# Patient Record
Sex: Female | Born: 1965 | Race: White | Hispanic: No | Marital: Single | Smoking: Never smoker
Health system: Southern US, Community
[De-identification: ages and names within clinical notes are randomized; demographics above are authoritative.]

## PROBLEM LIST (undated history)

## (undated) DIAGNOSIS — T7840XA Allergy, unspecified, initial encounter: Secondary | ICD-10-CM

## (undated) HISTORY — PX: INGUINAL HERNIA REPAIR: SUR1180

## (undated) HISTORY — DX: Allergy, unspecified, initial encounter: T78.40XA

## (undated) HISTORY — PX: TONSILLECTOMY: SHX5217

## (undated) HISTORY — PX: TONSILLECTOMY: SUR1361

---

## 2009-11-22 ENCOUNTER — Other Ambulatory Visit: Admission: RE | Admit: 2009-11-22 | Discharge: 2009-11-22 | Payer: Self-pay | Admitting: Internal Medicine

## 2009-11-22 ENCOUNTER — Encounter (INDEPENDENT_AMBULATORY_CARE_PROVIDER_SITE_OTHER): Payer: Self-pay | Admitting: *Deleted

## 2009-11-22 ENCOUNTER — Ambulatory Visit: Payer: Self-pay | Admitting: Internal Medicine

## 2009-11-22 LAB — CONVERTED CEMR LAB
CO2: 27 meq/L
Calcium: 8.9 mg/dL
Cholesterol: 252 mg/dL
Creatinine, Ser: 0.58 mg/dL
Eosinophils Relative: 3 %
GC Probe Amp, Genital: NEGATIVE
HCT: 40.6 %
HDL: 81 mg/dL
Hemoglobin: 13.6 g/dL
Lymphocytes, automated: 45 %
Monocytes Relative: 9 %
RBC: 4.34 M/uL
RDW: 13 %
Triglyceride fasting, serum: 130 mg/dL

## 2009-11-22 LAB — HM PAP SMEAR

## 2009-11-23 ENCOUNTER — Encounter: Payer: Self-pay | Admitting: Internal Medicine

## 2009-11-25 ENCOUNTER — Encounter: Payer: Self-pay | Admitting: Internal Medicine

## 2009-11-25 LAB — CONVERTED CEMR LAB: TSH: 0.986 microintl units/mL (ref 0.350–4.500)

## 2009-11-27 ENCOUNTER — Telehealth: Payer: Self-pay | Admitting: Internal Medicine

## 2010-01-07 ENCOUNTER — Telehealth: Payer: Self-pay | Admitting: Internal Medicine

## 2010-02-11 ENCOUNTER — Encounter: Admission: RE | Admit: 2010-02-11 | Discharge: 2010-02-11 | Payer: Self-pay | Admitting: Internal Medicine

## 2010-02-11 LAB — HM MAMMOGRAPHY: HM Mammogram: NEGATIVE

## 2010-03-03 ENCOUNTER — Ambulatory Visit: Payer: Self-pay | Admitting: Internal Medicine

## 2010-03-03 DIAGNOSIS — B360 Pityriasis versicolor: Secondary | ICD-10-CM | POA: Insufficient documentation

## 2010-03-10 ENCOUNTER — Telehealth: Payer: Self-pay | Admitting: Internal Medicine

## 2010-03-19 ENCOUNTER — Telehealth: Payer: Self-pay | Admitting: Internal Medicine

## 2010-04-24 ENCOUNTER — Telehealth: Payer: Self-pay | Admitting: Internal Medicine

## 2010-04-25 ENCOUNTER — Ambulatory Visit: Payer: Self-pay | Admitting: Internal Medicine

## 2010-04-25 DIAGNOSIS — L819 Disorder of pigmentation, unspecified: Secondary | ICD-10-CM | POA: Insufficient documentation

## 2010-06-03 NOTE — Progress Notes (Signed)
Summary: UTI?  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 Please advise Initial call taken by: Ami Bullins CMA,  March 10, 2010 10:08 AM  Follow-up for Phone Call        Left detailed vm on pt's hm # Follow-up by: Lamar Sprinkles, CMA,  March 10, 2010 11:24 AM    New/Updated Medications: CIPRO 250 MG TAB (CIPROFLOXACIN HCL) Take 1 tablet by mouth morning and night X 5 days Prescriptions: CIPRO 250 MG TAB (CIPROFLOXACIN HCL) Take 1 tablet by mouth morning and night X 5 days  #10 x 0   Entered and Authorized by:   Etta Grandchild MD   Signed by:   Etta Grandchild MD on 03/10/2010   Method used:   Electronically to        [facility] (retail)       [address]       [place], [place]  [address]       Ph: [phone] or [phone]       Fax: [phone]   RxID:   [id number]

## 2010-06-03 NOTE — Progress Notes (Signed)
Summary: REQ FOR RX  Phone Note Call from Patient Call back at Home Phone [phone]   Summary of Call: Pt says at last office visit Dr Yetta Barre discussed rash on her chest. She is req rx for antifungal med - ketoconozole cream.   FYI- She has "taken to heart" instruction from MD at last office visit to loose weight. Also has scheduled mamogram.  Initial call taken by: Lamar Sprinkles, CMA,  January 07, 2010 3:23 PM  Follow-up for Phone Call        left mess to call office back with pharm info Follow-up by: Lamar Sprinkles, CMA,  January 08, 2010 12:35 PM  Additional Follow-up for Phone Call Additional follow up Details #1::        Pt informed, she is also req rx for single dose of 400mg  tab along w/cream.  Additional Follow-up by: Lamar Sprinkles, CMA,  January 08, 2010 2:49 PM    New/Updated Medications: KETOCONAZOLE 2 % CREA (KETOCONAZOLE) Apply to AA two times a day Prescriptions: KETOCONAZOLE 2 % CREA (KETOCONAZOLE) Apply to AA two times a day  #30 gms x 0   Entered by:   Lamar Sprinkles, CMA   Authorized by:   Etta Grandchild MD   Signed by:   Lamar Sprinkles, CMA on 01/08/2010   Method used:   Electronically to        [facility] (retail)       [address]       [place], [place]  [address]       Ph: [phone] or [phone]       Fax: [phone]   RxID:   [id number] KETOCONAZOLE 2 % CREA (KETOCONAZOLE) Apply to AA two times a day  #30 gms x 0   Entered and Authorized by:   Etta Grandchild MD   Signed by:   Etta Grandchild MD on 01/08/2010   Method used:   Historical   RxID:   [id number]

## 2010-06-03 NOTE — Progress Notes (Signed)
  Phone Note Refill Request Message from:  Fax from Pharmacy on March 19, 2010 8:16 AM  Refills Requested: Medication #1:  KETOCONAZOLE 200 MG TABS Take one as directed.   Supply Requested: 1 month   Last Refilled: 01/19/2010 [facility]  Initial call taken by: Rock Nephew CMA,  March 19, 2010 8:16 AM    Prescriptions: KETOCONAZOLE 200 MG TABS (KETOCONAZOLE) Take one as directed  #1 x 3   Entered by:   Rock Nephew CMA   Authorized by:   Etta Grandchild MD   Signed by:   Rock Nephew CMA on 03/19/2010   Method used:   Electronically to        [facility] (retail)       [address]       [place], [place]  [address]       Ph: [phone] or [phone]       Fax: [phone]   RxID:   [id number]

## 2010-06-03 NOTE — Progress Notes (Signed)
Summary: RESULTS  Phone Note Call from Patient Call back at Home Phone [phone]   Summary of Call: Patient is requesting results she continues to have symptoms. Left vm that results letter was mailed and all was normal. Does pt need any treatment for symptoms?  Initial call taken by: Lamar Sprinkles, CMA,  November 27, 2009 4:45 PM  Follow-up for Phone Call        what symptoms? Follow-up by: Etta Grandchild MD,  November 27, 2009 5:12 PM  Additional Follow-up for Phone Call Additional follow up Details #1::        Pt has large amounts of clear vaginal discharge for at least a 1 year. This is daily and does not seem to change baised on her monthly cycle.  Says she has just "gotten used to it" and is not exactly sure how long.  She has not had intercourse in 3 years. No pain or discoloration or odor.   Pt read medical article recenlty linking this symptom to endometrial cancer and she is concerned.   Additional Follow-up by: Lamar Sprinkles, CMA,  November 27, 2009 6:25 PM    Additional Follow-up for Phone Call Additional follow up Details #2::    her PAP smear was negative for cancer Follow-up by: Etta Grandchild MD,  November 27, 2009 8:17 PM   Appended Document: RESULTS Pt informed, and advised to follow up with GYN for further concerns. She agreed

## 2010-06-03 NOTE — Assessment & Plan Note (Signed)
Summary: NEW [employer] PT-#--PKG--STC   Vital Signs:  Patient profile:   45 year old female Menstrual status:  regular LMP:     11/22/2009 Height:      64 inches Weight:      138.50 pounds BMI:     23.86 O2 Sat:      97 % on Room air Temp:     98.1 degrees F oral Pulse rate:   80 / minute Pulse rhythm:   regular Resp:     16 per minute BP sitting:   120 / 82  (left arm) Cuff size:   regular  Vitals Entered By: Rock Nephew CMA (November 22, 2009 1:32 PM)  O2 Flow:  Room air CC: New to establish Is Patient Diabetic? No Pain Assessment Patient in pain? no      LMP (date): 11/22/2009     Menstrual Status regular Enter LMP: 11/22/2009   Primary Care Provider:  Etta Grandchild MD  CC:  New to establish.  History of Present Illness: New to me for a complete physical-no complaints.  Preventive Screening-Counseling & Management  Alcohol-Tobacco     Alcohol drinks/day: <1     Alcohol type: wine     >5/day in last 3 mos: no     Alcohol Counseling: not indicated; use of alcohol is not excessive or problematic     Feels need to cut down: no     Feels annoyed by complaints: no     Feels guilty re: drinking: no     Needs 'eye opener' in am: no     Smoking Status: never  Caffeine-Diet-Exercise     Does Patient Exercise: yes  Hep-HIV-STD-Contraception     Hepatitis Risk: no risk noted     HIV Risk: no risk noted     STD Risk: no risk noted     SBE monthly: yes     SBE Education/Counseling: to perform regular SBE  Safety-Violence-Falls     Seat Belt Use: yes     Helmet Use: yes     Firearms in the Home: no firearms in the home     Smoke Detectors: yes     Violence in the Home: no risk noted     Sexual Abuse: no      Sexual History:  currently monogamous.        Drug Use:  never and no.        Blood Transfusions:  no.    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 Never Smoked Alcohol use-no Drug use-no Regular exercise-yes Smoking Status:  never Drug Use:  never, no Does Patient Exercise:  yes Hepatitis Risk:  no risk noted HIV Risk:  no risk noted STD Risk:  no risk noted Seat Belt Use:  yes Sexual History:  currently monogamous Blood Transfusions:  no  Review of Systems  The patient denies anorexia, fever, weight loss, weight gain, chest pain, dyspnea on exertion, peripheral edema, prolonged cough, hemoptysis, abdominal pain, hematuria, genital sores, suspicious skin lesions, difficulty walking, depression, abnormal bleeding, enlarged lymph nodes, angioedema, and breast masses.   GU:  Denies abnormal vaginal bleeding, decreased libido, discharge, dysuria, hematuria, urinary frequency, and urinary hesitancy.  Physical Exam  General:  alert, well-developed, well-nourished, well-hydrated, appropriate dress, normal appearance,  healthy-appearing, cooperative to examination, and good hygiene.   Head:  normocephalic and atraumatic.   Eyes:  vision grossly intact, pupils equal, pupils round, and pupils reactive to light.   Mouth:  Oral mucosa and oropharynx without lesions or exudates.  Teeth in good repair. Neck:  supple, full ROM, no masses, no thyromegaly, no JVD, normal carotid upstroke, no carotid bruits, no cervical lymphadenopathy, and no neck tenderness.   Lungs:  normal respiratory effort, no intercostal retractions, no accessory muscle use, normal breath sounds, no dullness, no fremitus, no crackles, and no wheezes.   Heart:  normal rate, regular rhythm, no murmur, no gallop, no rub, and no JVD.   Abdomen:  soft, non-tender, normal bowel sounds, no distention, no masses, no guarding, no rigidity, no rebound tenderness, no abdominal hernia, no  hepatomegaly, and no splenomegaly.   Genitalia:  normal introitus, no external lesions, mucosa pink and moist, no vaginal or cervical lesions, no vaginal atrophy, no friaility or hemorrhage, normal uterus size and position, no adnexal masses or tenderness, and vaginal discharge.   Msk:  normal ROM, no joint tenderness, no joint swelling, no joint warmth, no redness over joints, no joint deformities, no joint instability, and no crepitation.   Pulses:  R and L carotid,radial,femoral,dorsalis pedis and posterior tibial pulses are full and equal bilaterally Extremities:  No clubbing, cyanosis, edema, or deformity noted with normal full range of motion of all joints.   Neurologic:  No cranial nerve deficits noted. Station and gait are normal. Plantar reflexes are down-going bilaterally. DTRs are symmetrical throughout. Sensory, motor and coordinative functions appear intact. Skin:  turgor normal, color normal, no rashes, no suspicious lesions, no ecchymoses, no petechiae, no purpura, no ulcerations, and no edema.   Cervical Nodes:  no anterior cervical adenopathy and no posterior cervical adenopathy.   Axillary Nodes:  no R axillary adenopathy and no L axillary adenopathy.   Inguinal Nodes:  no R inguinal adenopathy and no L inguinal adenopathy.   Psych:  Cognition and judgment appear intact. Alert and cooperative with normal attention span and concentration. No apparent delusions, illusions, hallucinations   Impression & Recommendations:  Problem # 1:  VAGINAL DISCHARGE (ICD-623.5) Assessment New  Orders: T-Chlamydia Probe, genital [id number]) T-GC Probe, genital [phone])  Problem # 2:  ROUTINE GENERAL MEDICAL EXAM@HEALTH  CARE FACL (ICD-V70.0) Assessment: New  Orders: Venipuncture ([address]) TLB-Lipid Panel (80061-LIPID) TLB-BMP (Basic Metabolic Panel-BMET) (80048-METABOL) TLB-CBC Platelet - w/Differential (85025-CBCD) TLB-TSH (Thyroid Stimulating Hormone) [id number]) Radiology  Referral (Radiology)  Discussed using sunscreen, use of alcohol, drug use, self breast exam, routine dental care, routine eye care, schedule for GYN exam, routine physical exam, seat belts, multiple vitamins, osteoporosis prevention, adequate calcium intake in diet, recommendations for immunizations, mammograms and Pap smears.  Discussed exercise and checking cholesterol.  Discussed gun safety, safe sex, and contraception.  Patient Instructions: 1)  It is important that you exercise regularly at least 20 minutes 5 times a week. If you develop chest pain, have severe difficulty breathing, or feel very tired , stop exercising immediately and seek medical attention. 2)  You need to lose weight. Consider a lower calorie diet and regular exercise.  3)  Schedule your mammogram.   Tetanus/Td Immunization History:    Tetanus/Td # 1:  Tdap (01/12/2007)

## 2010-06-03 NOTE — Letter (Signed)
Summary: Results Follow up Letter  [facility]  [address] [place], [place] [address]   Phone: [phone]  Fax: [phone]    03/03/2010 MRN: [id number]  [facility] [address] [place], [place]  [address]   To Whom It May Concern:   The above named patient received a flu vaccine in our office today.    Lot# [id number]  Exp Date: 11/01/2010  Right Deltoid    Sincerely,   Fayne Mediate, CMA for Dr. Sanda Linger

## 2010-06-03 NOTE — Assessment & Plan Note (Signed)
Summary: BROWN SPOTS/CHEST--AND FLU SHOT-CREAM NOT WORK'G--STC   Vital Signs:  Patient profile:   45 year old female Menstrual status:  regular LMP:     02/11/2010 Height:      64 inches Weight:      134 pounds BMI:     23.08 O2 Sat:      97 % on Room air Temp:     98.0 degrees F oral Pulse rate:   62 / minute Pulse rhythm:   regular Resp:     16 per minute BP sitting:   118 / 68  (left arm) Cuff size:   regular  Vitals Entered By: Rock Nephew CMA (March 03, 2010 8:19 AM)  O2 Flow:  Room air CC: follow-up visit// discuss brown spots on chest/ flu shot, Rash Is Patient Diabetic? No Pain Assessment Patient in pain? no      LMP (date): 02/11/2010     Enter LMP: 02/11/2010 Last PAP Result NEGATIVE FOR INTRAEPITHELIAL LESIONS OR MALIGNANCY.   Primary Care Graeme Menees:  Etta Grandchild MD  CC:  follow-up visit// discuss brown spots on chest/ flu shot and Rash.  History of Present Illness:  Rash      This is a 45 year old woman who presents with Rash.  The symptoms began 4 weeks ago.  The severity is described as mild.  The patient reports macules, but denies papules, nodules, hives, welts, pustules, blisters, ulcers, itching, scaling, weeping, oozing, redness, increased warmth, and tenderness.  The rash is located on the chest.  The patient denies the following symptoms: fever, headache, facial swelling, tongue swelling, burning, difficulty breathing, abdominal pain, nausea, vomiting, diarrhea, dizziness, sore throat, dysuria, eye symptoms, arthralgias, and vaginal discharge.  The patient denies history of recent tick bite, recent tick exposure, other insect bite, recent infection, recent antibiotic use, new medication, new clothing, new topical exposure, recent travel, pet/animal contact, thyroid disease, chronic liver disease, autoimmune disease, chronic edema, and prior STD.    Preventive Screening-Counseling & Management  Alcohol-Tobacco     Alcohol drinks/day: <1  Alcohol type: wine     >5/day in last 3 mos: no     Alcohol Counseling: not indicated; use of alcohol is not excessive or problematic     Feels need to cut down: no     Feels annoyed by complaints: no     Feels guilty re: drinking: no     Needs 'eye opener' in am: no     Smoking Status: never     Tobacco Counseling: not indicated; no tobacco use  Hep-HIV-STD-Contraception     Hepatitis Risk: no risk noted     HIV Risk: no risk noted     STD Risk: no risk noted     SBE monthly: yes     SBE Education/Counseling: to perform regular SBE      Sexual History:  not active.        Drug Use:  never.        Blood Transfusions:  no.    Clinical Review Panels:  Prevention   Last Mammogram:  ASSESSMENT: Negative - BI-RADS 1^MM DIGITAL SCREENING (02/11/2010)   Last Pap Smear:  NEGATIVE FOR INTRAEPITHELIAL LESIONS OR MALIGNANCY. (11/22/2009)  Immunizations   Last Tetanus Booster:  Tdap (01/12/2007)   Last Flu Vaccine:  Fluvax 3+ (03/03/2010)  Lipid Management   Cholesterol:  252 (11/22/2009)   LDL (bad choesterol):  26 (11/22/2009)   HDL (good cholesterol):  81 (11/22/2009)   Triglycerides:  130 (  11/22/2009)  Diabetes Management   Creatinine:  0.58 (11/22/2009)   Last Flu Vaccine:  Fluvax 3+ (03/03/2010)  CBC   WBC:  5.6 (11/22/2009)   RBC:  4.34 (11/22/2009)   Hgb:  13.6 (11/22/2009)   Hct:  40.6 (11/22/2009)   Platelets:  297 (11/22/2009)   MCV  93.5 (11/22/2009)   RDW  13.0 (11/22/2009)   Monos:  9% (11/22/2009)   Eosinophils:  3% (11/22/2009)   Basophil:  1% (11/22/2009)  Complete Metabolic Panel   Sodium:  137 (11/22/2009)   Potassium:  4.1 (11/22/2009)   Chloride:  104 (11/22/2009)   CO2:  27 (11/22/2009)   BUN:  11 (11/22/2009)   Creatinine:  0.58 (11/22/2009)   Calcium:  8.9 (11/22/2009)   Medications Prior to Update: 1)  Ketoconazole 2 % Crea (Ketoconazole) .... Apply To Aa Two Times A Day  Current Medications (verified): 1)  Ketoconazole 200 Mg Tabs  (Ketoconazole) .... 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 Family History High cholesterol Family History Hypertension  Social History: Reviewed history from 11/22/2009 and no changes required. 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    Physical Exam  General:  alert, well-developed, well-nourished, well-hydrated, appropriate dress, normal appearance, healthy-appearing, cooperative to examination, and good hygiene.   Mouth:  Oral mucosa and oropharynx without lesions or exudates.  Teeth in good repair. Neck:  supple, full ROM, no masses, no thyromegaly, no JVD, normal carotid upstroke, no carotid bruits, no cervical lymphadenopathy, and no neck tenderness.   Lungs:  normal respiratory effort, no intercostal retractions, no accessory muscle use, normal breath sounds, no dullness, no fremitus, no crackles, and no wheezes.   Heart:  normal rate, regular rhythm, no murmur, no gallop, no rub, and no JVD.   Abdomen:  soft, non-tender, normal bowel sounds, no distention, no masses, no guarding, no  rigidity, no rebound tenderness, no abdominal hernia, no hepatomegaly, and no splenomegaly.   Msk:  normal ROM, no joint tenderness, no joint swelling, no joint warmth, no redness over joints, no joint deformities, no joint instability, and no crepitation.   Pulses:  R and L carotid,radial,femoral,dorsalis pedis and posterior tibial pulses are full and equal bilaterally Extremities:  No clubbing, cyanosis, edema, or deformity noted with normal full range of motion of all joints.   Neurologic:  No cranial nerve deficits noted. Station and gait are normal. Plantar reflexes are down-going bilaterally. DTRs are symmetrical throughout. Sensory, motor and coordinative functions appear intact. Skin:  she has hypopigmented macules across her chest Cervical Nodes:  no anterior cervical adenopathy and no posterior cervical adenopathy.   Axillary Nodes:  no R axillary adenopathy and no L axillary adenopathy.   Inguinal Nodes:  no R inguinal adenopathy and no L inguinal adenopathy.  Psych:  Cognition and judgment appear intact. Alert and cooperative with normal attention span and concentration. No apparent delusions, illusions, hallucinations   Impression & Recommendations:  Problem # 1:  TINEA VERSICOLOR (ICD-111.0)  The following medications were removed from the medication list:    Ketoconazole 2 % Crea (Ketoconazole) .Marland Kitchen... Apply to aa two times a day Her updated medication list for this problem includes:    Ketoconazole 200 Mg Tabs (Ketoconazole) .Marland Kitchen... Take one as directed  Complete Medication List: 1)  Ketoconazole 200 Mg Tabs (Ketoconazole) .... Take one as directed  Other Orders: Admin 1st Vaccine (960[address]) Flu Vaccine 80yrs + (40981)  Patient Instructions: 1)  Please schedule a follow-up appointment in 3 months. 2)  If you are having sex and you or your partner don't want a child, use contraception. Prescriptions: KETOCONAZOLE 200 MG TABS (KETOCONAZOLE) Take one as directed  #1 x 2    Entered and Authorized by:   Etta Grandchild MD   Signed by:   Etta Grandchild MD on 03/03/2010   Method used:   Electronically to        [facility] [address] [facility] (retail)       [address]       [place], [place]  [address]       Ph: [phone]       Fax: [phone]   RxID:   [id number]    Orders Added: 1)  Admin 1st Vaccine [90471] 2)  Flu Vaccine 13yrs + [36644] 3)  Est. Patient Level IV [03474]    .lbflu Flu Vaccine Consent Questions     Do you have a history of severe allergic reactions to this vaccine? no    Any prior history of allergic reactions to egg and/or gelatin? no    Do you have a sensitivity to the preservative Thimersol? no    Do you have a past history of Guillan-Barre Syndrome? no    Do you currently have an acute febrile illness? no    Have you ever had a severe reaction to latex? no    Vaccine information given and explained to patient? yes    Are you currently pregnant? no    Lot Number:[id number]   Exp Date:11/01/2010   Site Given Right Deltoid IM

## 2010-06-03 NOTE — Letter (Signed)
Summary: Results Follow-up Letter  [facility]  [address] [place], [place] [address]   Phone: [phone]  Fax: [phone]    11/25/2009  [address] [place], [place]  [address]  Dear Connie Freeman,   The following are the results of your recent test(s):  Test     Result     Pap Smear    Normal___xx____  Not Normal_____       Comments:tests for infection are negative and thyroid test is normal    _________________________________________________________  Please call for an appointment as needed _________________________________________________________ _________________________________________________________ _________________________________________________________  Sincerely,  Sanda Linger MD [facility]

## 2010-06-03 NOTE — Letter (Signed)
Summary: Lipid Letter  [facility]  [address] [place], [place] [address]   Phone: [phone]  Fax: [phone]    11/25/2009  Connie Freeman [address] [place], [place]  [address]  Dear Marcelino Duster:  We have carefully reviewed your last lipid profile from 11/22/2009 and the results are noted below with a summary of recommendations for lipid management.    Cholesterol:       252     Goal: <200   HDL "good" Cholesterol:   81     Goal: >40-great news!!!!!!!   LDL "bad" Cholesterol:   145     Goal: <130   Triglycerides:       130     Goal: <150    your other labs look great    TLC Diet (Therapeutic Lifestyle Change): Saturated Fats & Transfatty acids should be kept < 7% of total calories ***Reduce Saturated Fats Polyunstaurated Fat can be up to 10% of total calories Monounsaturated Fat Fat can be up to 20% of total calories Total Fat should be no greater than 25-35% of total calories Carbohydrates should be 50-60% of total calories Protein should be approximately 15% of total calories Fiber should be at least 20-30 grams a day ***Increased fiber may help lower LDL Total Cholesterol should be < 200mg /day Consider adding plant stanol/sterols to diet (example: Benacol spread) ***A higher intake of unsaturated fat may reduce Triglycerides and Increase HDL    Adjunctive Measures (may lower LIPIDS and reduce risk of Heart Attack) include: Aerobic Exercise (20-30 minutes 3-4 times a week) Limit Alcohol Consumption Weight Reduction Aspirin 75-81 mg a day by mouth (if not allergic or contraindicated) Dietary Fiber 20-30 grams a day by mouth     Current Medications:  None If you have any questions, please call. We appreciate being able to work with you.   Sincerely,    [facility] Etta Grandchild MD

## 2010-06-05 NOTE — Assessment & Plan Note (Signed)
Summary: FOLLOW UP FOR PT FOR RX-LB   Vital Signs:  Patient profile:   45 year old female Menstrual status:  regular LMP:     04/12/2010 Height:      64 inches Weight:      137 pounds BMI:     23.60 O2 Sat:      98 % on Room air Temp:     98.3 degrees F oral Pulse rate:   73 / minute Pulse rhythm:   regular Resp:     16 per minute BP sitting:   116 / 80  (left arm) Cuff size:   regular  Vitals Entered By: Rock Nephew CMA (April 25, 2010 8:12 AM)  O2 Flow:  Room air CC: Patient here to discuss hormones and medication Is Patient Diabetic? No Pain Assessment Patient in pain? no       Does patient need assistance? Functional Status Self care Ambulation Normal LMP (date): 04/12/2010     Enter LMP: 04/12/2010 Last PAP Result NEGATIVE FOR INTRAEPITHELIAL LESIONS OR MALIGNANCY.   Primary Care Provider:  Etta Grandchild MD  CC:  Patient here to discuss hormones and medication.  History of Present Illness: She returns today and tells me that she wants to start OCP's again. She took Demulen for 10 years and she tells me that it helped with her dysmenorrhea symptoms (bloating and mood swings). Her last menstrual cycle was normal otherwise.  Also, she complains of persistent rash over her anterior chest that she feels is unsightly but it does not bother her. She does not like the abnormal appearance of the pigment.  Preventive Screening-Counseling & Management  Alcohol-Tobacco     Alcohol drinks/day: <1     Alcohol type: wine     >5/day in last 3 mos: no     Alcohol Counseling: not indicated; use of alcohol is not excessive or problematic     Feels need to cut down: no     Feels annoyed by complaints: no     Feels guilty re: drinking: no     Needs 'eye opener' in am: no     Smoking Status: never     Tobacco Counseling: not indicated; no tobacco use  Current Medications (verified): 1)  None  Allergies (verified): No Known Drug Allergies  Past  History:  Past Medical History: Last updated: 11/22/2009 Depression  Past Surgical History: Last updated: 11/22/2009 Tonsillectomy  Family History: Last updated: 11/22/2009 Family History High cholesterol Family History Hypertension  Social History: Last updated: 11/22/2009 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 Family History High cholesterol Family History Hypertension  Social History: Reviewed history from 11/22/2009 and no changes required. 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   GU:  Denies abnormal vaginal bleeding and discharge. Derm:  Complains of changes in color of skin and rash; denies changes in nail beds, dryness, excessive perspiration, flushing, insect bite(s), itching, and poor wound healing.  Physical  Exam  General:  alert, well-developed, well-nourished, well-hydrated, appropriate dress, normal appearance, healthy-appearing, cooperative to examination, and good hygiene.   Head:  normocephalic and atraumatic.   Eyes:  vision grossly intact, pupils equal, pupils round, and pupils reactive to light.   Mouth:  Oral mucosa and oropharynx without lesions or exudates.  Teeth in good repair. Neck:  supple, full ROM, no masses, no thyromegaly, no JVD,  normal carotid upstroke, no carotid bruits, no cervical lymphadenopathy, and no neck tenderness.   Lungs:  normal respiratory effort, no intercostal retractions, no accessory muscle use, normal breath sounds, no dullness, no fremitus, no crackles, and no wheezes.   Heart:  normal rate, regular rhythm, no murmur, no gallop, no rub, and no JVD.   Abdomen:  soft, non-tender, normal bowel sounds, no distention, no masses, no guarding, no rigidity, no rebound tenderness, no abdominal hernia, no hepatomegaly, and no splenomegaly.   Msk:  normal ROM, no joint tenderness, no joint swelling, no joint warmth, no redness over joints, no joint deformities, no joint instability, and no crepitation.   Pulses:  R and L carotid,radial,femoral,dorsalis pedis and posterior tibial pulses are full and equal bilaterally Extremities:  No clubbing, cyanosis, edema, or deformity noted with normal full range of motion of all joints.   Neurologic:  No cranial nerve deficits noted. Station and gait are normal. Plantar reflexes are down-going bilaterally. DTRs are symmetrical throughout. Sensory, motor and coordinative functions appear intact. Skin:  she has abnormal pigment changes over her anterior chest that is c/w chloasma. turgor normal, no rashes, no suspicious lesions, no ecchymoses, no petechiae, no purpura, no ulcerations, and no edema.   Cervical Nodes:  no anterior cervical adenopathy and no posterior cervical adenopathy.   Axillary Nodes:  no R axillary adenopathy and no L axillary adenopathy.   Inguinal Nodes:  no R inguinal adenopathy and no L inguinal adenopathy.   Psych:  Cognition and judgment appear intact. Alert and cooperative with normal attention span and concentration. No apparent delusions, illusions, hallucinations   Impression & Recommendations:  Problem # 1:  MELASMA (ICD-709.09) Assessment New  Orders: Dermatology Referral (Derma)  Problem # 2:  CONTRACEPTIVE MANAGEMENT  (ICD-V25.09) Assessment: New I looked for Demulen but I could not find it on the meds list so I wrote an Rx for Zovia which has the same ingredients, she will start the Zovia on the first "Sunday after her next cycle Orders: TLB-Preg Serum Quant (B-hCG) (84702-HCG-QN)  Problem # 3:  TINEA VERSICOLOR (ICD-111.0) Assessment: Improved  The following medications were removed from the medication list:    Ketoconazole 200 Mg Tabs (Ketoconazole) ..... Take one as directed  Complete Medication List: 1)  Zovia 1/35e (28) 1-35 Mg-mcg Tabs (Ethynodiol diac-eth estradiol) .... Take as directed  Patient Instructions: 1)  Please schedule a follow-up appointment in 6 months. 2)  If you could be exposed to sexually transmitted diseases, you should use a condom. 3)  If you are having sex and you or your partner don't want a child, use contraception. Prescriptions: ZOVIA 1/35E (28) 1-35 MG-MCG TABS (ETHYNODIOL DIAC-ETH ESTRADIOL) take as directed  #1 month x 11   Entered and Authorized by:   Chrishauna Mee L. Jermika Olden MD   Signed by:   Bayyinah Dukeman L. Deandrea Rion MD on 04/25/2010   Method used:   Electronically to        [facility] [address] [facility] (retail)       [address]  [place], [place]  [address]       Ph: [phone] or [phone]       Fax: [phone]   RxID:   [id number]    Orders Added: 1)  Dermatology Referral [Derma] 2)  TLB-Preg Serum Quant (B-hCG) [84702-HCG-QN] 3)  Est. Patient Level IV [55732]

## 2010-06-05 NOTE — Progress Notes (Signed)
Summary: rx for BCP  Phone Note Call from Patient Call back at Home Phone [phone]   Caller: Patient Summary of Call: Pt is requesting rx for [facility] pill Demulen be sent to [facility] Initial call taken by: Brenton Grills CMA Duncan Dull),  April 24, 2010 9:02 AM  Follow-up for Phone Call        I need to do a pregnancy test first, have her come in tomorrow Follow-up by: Etta Grandchild MD,  April 24, 2010 9:13 AM  Additional Follow-up for Phone Call Additional follow up Details #1::        for OV or Nurse visit? Additional Follow-up by: Brenton Grills CMA Duncan Dull),  April 24, 2010 9:30 AM    Additional Follow-up for Phone Call Additional follow up Details #2::    OV Follow-up by: Etta Grandchild MD,  April 24, 2010 9:41 AM  Additional Follow-up for Phone Call Additional follow up Details #3:: Details for Additional Follow-up Action Taken: left message for pt to callback office to schedule OV   appointment scheduled 04/25/2010 8:00am Brenton Grills CMA (AAMA)  April 24, 2010 11:52 AM  Additional Follow-up by: Brenton Grills CMA Duncan Dull),  April 24, 2010 9:46 AM

## 2010-06-09 ENCOUNTER — Ambulatory Visit: Payer: Self-pay | Admitting: Internal Medicine

## 2010-06-27 ENCOUNTER — Ambulatory Visit (INDEPENDENT_AMBULATORY_CARE_PROVIDER_SITE_OTHER): Payer: 59 | Admitting: Internal Medicine

## 2010-06-27 ENCOUNTER — Encounter: Payer: Self-pay | Admitting: Internal Medicine

## 2010-06-27 ENCOUNTER — Telehealth: Payer: Self-pay | Admitting: Internal Medicine

## 2010-06-27 DIAGNOSIS — J3089 Other allergic rhinitis: Secondary | ICD-10-CM | POA: Insufficient documentation

## 2010-06-27 DIAGNOSIS — J069 Acute upper respiratory infection, unspecified: Secondary | ICD-10-CM

## 2010-06-27 DIAGNOSIS — J45901 Unspecified asthma with (acute) exacerbation: Secondary | ICD-10-CM | POA: Insufficient documentation

## 2010-07-01 NOTE — Assessment & Plan Note (Signed)
Summary: ALLERGIES/NWS   Vital Signs:  Patient profile:   45 year old female Menstrual status:  regular Height:      64 inches Weight:      138 pounds BMI:     23.77 O2 Sat:      97 % on Room air Temp:     98.3 degrees F oral Pulse rate:   90 / minute Pulse rhythm:   regular Resp:     16 per minute BP sitting:   110 / 70  (left arm) Cuff size:   regular  Vitals Entered By: Bill Salinas CMA (June 27, 2010 11:23 AM)  O2 Flow:  Room air  Primary Care Provider:  Etta Grandchild MD  CC:  URI symptoms.  History of Present Illness:  URI Symptoms      This is a 45 year old woman who presents with URI symptoms.  The symptoms began 3 days ago.  The severity is described as mild.  The patient reports nasal congestion, clear nasal discharge, sore throat, and dry cough, but denies productive cough, earache, and sick contacts.  Associated symptoms include dyspnea and wheezing.  The patient denies stiff neck, rash, vomiting, diarrhea, use of an antipyretic, and response to antipyretic.  The patient also reports sneezing and seasonal symptoms.  The patient denies response to antihistamine, headache, muscle aches, and severe fatigue.  The patient denies the following risk factors for Strep sinusitis: unilateral facial pain, unilateral nasal discharge, poor response to decongestant, double sickening, tooth pain, Strep exposure, tender adenopathy, and absence of cough.    Preventive Screening-Counseling & Management  Alcohol-Tobacco     Alcohol drinks/day: <1     Alcohol type: wine     >5/day in last 3 mos: no     Alcohol Counseling: not indicated; use of alcohol is not excessive or problematic     Feels need to cut down: no     Feels annoyed by complaints: no     Feels guilty re: drinking: no     Needs 'eye opener' in am: no     Smoking Status: never     Tobacco Counseling: not indicated; no tobacco use  Hep-HIV-STD-Contraception     Hepatitis Risk: no risk noted     HIV Risk: no risk  noted     STD Risk: no risk noted     SBE monthly: yes     SBE Education/Counseling: to perform regular SBE      Sexual History:  not active.        Drug Use:  never.        Blood Transfusions:  no.    Clinical Review Panels:  Prevention   Last Mammogram:  ASSESSMENT: Negative - BI-RADS 1^MM DIGITAL SCREENING (02/11/2010)   Last Pap Smear:  NEGATIVE FOR INTRAEPITHELIAL LESIONS OR MALIGNANCY. (11/22/2009)  Immunizations   Last Tetanus Booster:  Tdap (01/12/2007)   Last Flu Vaccine:  Fluvax 3+ (03/03/2010)  Lipid Management   Cholesterol:  252 (11/22/2009)   LDL (bad choesterol):  26 (11/22/2009)   HDL (good cholesterol):  81 (11/22/2009)   Triglycerides:  130 (11/22/2009)  Diabetes Management   Creatinine:  0.58 (11/22/2009)   Last Flu Vaccine:  Fluvax 3+ (03/03/2010)  CBC   WBC:  5.6 (11/22/2009)   RBC:  4.34 (11/22/2009)   Hgb:  13.6 (11/22/2009)   Hct:  40.6 (11/22/2009)   Platelets:  297 (11/22/2009)   MCV  93.5 (11/22/2009)   RDW  13.0 (11/22/2009)  Monos:  9% (11/22/2009)   Eosinophils:  3% (11/22/2009)   Basophil:  1% (11/22/2009)  Complete Metabolic Panel   Sodium:  137 (11/22/2009)   Potassium:  4.1 (11/22/2009)   Chloride:  104 (11/22/2009)   CO2:  27 (11/22/2009)   BUN:  11 (11/22/2009)   Creatinine:  0.58 (11/22/2009)   Calcium:  8.9 (11/22/2009)   Medications Prior to Update: 1)  Zovia 1/35e (28) 1-35 Mg-Mcg Tabs (Ethynodiol Diac-Eth Estradiol) .... Take As Directed  Current Medications (verified): 1)  Zovia 1/35e (28) 1-35 Mg-Mcg Tabs (Ethynodiol Diac-Eth Estradiol) .... Take As Directed 2)  Zutripro 60-4-5 Mg/86ml Soln (Pseudoeph-Chlorphen-Hydrocod) .... 5 Ml By Mouth Qid As Needed For Cough and Congestion 3)  Symbicort 160-4.5 Mcg/act Aero (Budesonide-Formoterol Fumarate) .... 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 Family History High cholesterol Family History Hypertension  Social History: Reviewed history from 11/22/2009 and no changes required. 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  The patient denies anorexia, fever, weight loss, decreased hearing, chest pain, syncope, dyspnea on exertion, peripheral edema, prolonged cough, headaches, hemoptysis, abdominal pain, hematuria, muscle weakness, suspicious skin lesions, transient blindness, enlarged lymph nodes, and angioedema.    Physical Exam  General:  alert, well-developed, well-nourished, well-hydrated, appropriate dress, normal appearance, healthy-appearing, cooperative to examination, and good hygiene.   Head:  normocephalic and atraumatic.   Eyes:  vision grossly intact, pupils equal, pupils round, and pupils reactive to light.   Ears:  R ear normal and L ear normal.   Nose:  no external deformity, clear nasal discharge, mucosal pallor, mucosal erythema, and mucosal edema.  no external deformity, no airflow obstruction, no intranasal foreign body, no nasal polyps, no nasal mucosal lesions, no mucosal friability, no active bleeding or clots, no sinus percussion tenderness, no septum abnormalities. Mouth:  good dentition, pharynx pink and  moist, no erythema, no exudates, no posterior lymphoid hypertrophy, no lesions, no aphthous ulcers, no erosions, no tongue abnormalities, and no leukoplakia.   Neck:  supple, full ROM, no masses, no JVD, no HJR, and normal carotid upstroke.   Lungs:  normal respiratory effort, no accessory muscle use, no dullness, no fremitus, no crackles, R diffuse crackles, and L diffuse crackles.  She has good air movement. Heart:  normal rate, regular rhythm, no murmur, no gallop, and no rub.   Abdomen:  soft, non-tender, normal bowel sounds, no distention, no masses, no guarding, no rigidity, no rebound tenderness, no abdominal hernia, no hepatomegaly, and no splenomegaly.   Msk:  normal ROM, no joint tenderness, no joint swelling, no joint warmth, no redness over joints, no joint deformities, no joint instability, and no crepitation.   Pulses:  R and L carotid,radial,femoral,dorsalis pedis and posterior tibial pulses are full and equal bilaterally Extremities:  No clubbing, cyanosis, edema, or deformity noted with normal  full range of motion of all joints.   Neurologic:  No cranial nerve deficits noted. Station and gait are normal. Plantar reflexes are down-going bilaterally. DTRs are symmetrical throughout. Sensory, motor and coordinative functions appear intact. Skin:  she has abnormal pigment changes over her anterior chest that is c/w chloasma. turgor normal, no rashes, no suspicious lesions, no ecchymoses, no petechiae, no purpura, no ulcerations, and no edema.   Cervical Nodes:  no anterior cervical adenopathy and no posterior cervical adenopathy.   Psych:  Cognition and judgment appear intact. Alert and cooperative with normal attention span and concentration. No apparent delusions, illusions, hallucinations   Impression & Recommendations:  Problem # 1:  URI (ICD-465.9) Assessment New  this sounds viral so no anitbiotics were given Her updated medication list for this problem includes:    Zutripro  60-4-5 Mg/69ml Soln (Pseudoeph-chlorphen-hydrocod) .Marland KitchenMarland KitchenMarland KitchenMarland Kitchen 5 ml by mouth qid as needed for cough and congestion  Instructed on symptomatic treatment. Call if symptoms persist or worsen.   Problem # 2:  ALLERGIC RHINITIS DUE TO OTHER ALLERGEN (ICD-477.8) Assessment: New  give depo-medrol IM  Orders: Admin of Therapeutic Inj  intramuscular or subcutaneous ([address]) Depo- Medrol 80mg  (J1040) Depo- Medrol 40mg  (J1030)  Problem # 3:  ASTHMA NOS W/ACUTE EXACERBATION (UEA-540.98)  Her updated medication list for this problem includes:    Symbicort 160-4.5 Mcg/act Aero (Budesonide-formoterol fumarate) .Marland Kitchen... 2 puffs two times a day for wheezing  Complete Medication List: 1)  Zovia 1/35e (28) 1-35 Mg-mcg Tabs (Ethynodiol diac-eth estradiol) .... Take as directed 2)  Zutripro 60-4-5 Mg/4ml Soln (Pseudoeph-chlorphen-hydrocod) .... 5 ml by mouth qid as needed for cough and congestion 3)  Symbicort 160-4.5 Mcg/act Aero (Budesonide-formoterol fumarate) .... 2 puffs two times a day for wheezing  Patient Instructions: 1)  Please schedule a follow-up appointment in 2 weeks. 2)  Get plenty of rest, drink lots of clear liquids, and use Tylenol or Ibuprofen for fever and comfort. Return in 7-10 days if you're not better:sooner if you're feeling worse. Prescriptions: SYMBICORT 160-4.5 MCG/ACT AERO (BUDESONIDE-FORMOTEROL FUMARATE) 2 puffs two times a day for wheezing  #4 inhs x 0   Entered and Authorized by:   Etta Grandchild MD   Signed by:   Etta Grandchild MD on 06/27/2010   Method used:   Samples Given   RxID:   [id number] ZUTRIPRO 60-4-5 MG/5ML SOLN (PSEUDOEPH-CHLORPHEN-HYDROCOD) 5 ml by mouth QID as needed for cough and congestion  #8 ounces x 0   Entered and Authorized by:   Etta Grandchild MD   Signed by:   Etta Grandchild MD on 06/27/2010   Method used:   Print then Give to Patient   RxID:   [id number]    Medication Administration  Injection # 1:    Medication: Depo- Medrol  40mg     Diagnosis: ALLERGIC RHINITIS DUE TO OTHER ALLERGEN (ICD-477.8)    Route: IM    Site: LUOQ gluteus    Exp Date: 11/2012    Lot #: [id number]    Mfr: Pharmacia    Patient tolerated injection without complications    Given by: Ami Bullins CMA (June 27, 2010 11:25 AM)  Injection # 2:    Medication: Depo- Medrol 80mg     Diagnosis: ALLERGIC RHINITIS DUE TO OTHER ALLERGEN (ICD-477.8)    Route: IM    Site: LUOQ gluteus    Exp Date: 11/2012    Lot #: [id number]    Mfr: Pharmacia  Orders Added: 1)  Admin of Therapeutic  Inj  intramuscular or subcutaneous [[address]] 2)  Depo- Medrol 80mg  [J1040] 3)  Depo- Medrol 40mg  [J1030] 4)  Est. Patient Level V [[address]]

## 2010-07-01 NOTE — Progress Notes (Signed)
  Phone Note Other Incoming   Caller: [phone] Summary of Call: Pt called and states she is having severe allergic rhinitis. She states she has these episodes and when she lived in [place] she would get a 3 in one shot. Pt is reqeusting to come in and get a shot before she goes to work [employer]. Please Advise Initial call taken by: Ami Bullins CMA,  June 27, 2010 9:20 AM  Follow-up for Phone Call        I don't know what a 3 in 1 shot is but she can come in for a depo-medrol IM injection if she wants to Follow-up by: Etta Grandchild MD,  June 27, 2010 9:41 AM  Additional Follow-up for Phone Call Additional follow up Details #1::        pt worked in with Dr Yetta Barre this morning Additional Follow-up by: Ami Bullins CMA,  June 27, 2010 9:47 AM

## 2010-07-09 ENCOUNTER — Telehealth: Payer: Self-pay | Admitting: Internal Medicine

## 2010-07-15 NOTE — Progress Notes (Signed)
  Phone Note Refill Request Message from:  Fax from Pharmacy on July 09, 2010 2:53 PM  Refills Requested: Medication #1:  ZUTRIPRO 60-4-5 MG/5ML SOLN 5 ml by mouth QID as needed for cough and congestion   Dosage confirmed as above?Dosage Confirmed   Last Refilled: 06/27/2010 Is this ok to refill  Initial call taken by: Rock Nephew CMA,  July 09, 2010 2:53 PM  Follow-up for Phone Call        yes Follow-up by: Etta Grandchild MD,  July 09, 2010 3:01 PM    Prescriptions: ZUTRIPRO 60-4-5 MG/5ML SOLN (PSEUDOEPH-CHLORPHEN-HYDROCOD) 5 ml by mouth QID as needed for cough and congestion  #8 ounces x 1   Entered by:   Rock Nephew CMA   Authorized by:   Etta Grandchild MD   Signed by:   Rock Nephew CMA on 07/09/2010   Method used:   Telephoned to ...       [facility] (retail)       [address]       [place], [place]  [address]       Ph: [phone] or [phone]       Fax: [phone]   RxID:   [id number]

## 2010-09-27 ENCOUNTER — Telehealth: Payer: Self-pay | Admitting: Family Medicine

## 2010-09-27 NOTE — Telephone Encounter (Signed)
On call note.  Called with progressive abdominal pain, burning across the lower abdomen.  RN had advised pt to go to ER.  Pt demanded to speak with me.  I advised her to go to ER.  She requested pain meds.  I declined and told her that without eval, I couldn't safely treat her.  If her pain was bad enough to request pain meds, then she needed eval now.  ER is currently the only option.  Doing anything other than going to ER was AMA.  I will forward to PMD.

## 2010-09-28 NOTE — Telephone Encounter (Signed)
Repeat call from patient.  She didn't go to ER and hadn't had eval in meantime.  I was told by answering service that patient had been rude and patient demanded to speak with me.  Answering service had already and again advised eval by MD for patient.  Pt had continued abdominal pain.  Per her report, she had her urine tested at at outside facility and it 'looked like a UTI.'  I cannot verify this result.  I told the patient that she needed eval by MD and options were UC or ER.  She wanted to know why I wouldn't call in the medicine/abx and I told her that I wasn't comfortable calling in a medicine for a patient in this case.  She said I had "no business being on call," and I tried to explain that my duty on call was to provide medical advice, not just to call in meds.  She repeatedly interrupted me.  She said "you have no balls as a doctor."  At that point, I told her that I was going to hang up on her, and I did.  When she began to insult me, I ended the call because I had already given her my medical opinion and nothing else was likely to be accomplished with the call.

## 2010-09-30 ENCOUNTER — Telehealth: Payer: Self-pay

## 2010-09-30 NOTE — Telephone Encounter (Signed)
[facility] Triage Call Report Triage Record Num: [id number] Operator: Fernand Parkins Patient Name: Connie Freeman Call Date & Time: 09/27/2010 12:40:47AM Patient Phone: [phone] PCP: Sanda Linger Patient Gender: Female PCP Fax : Patient DOB: 04/23/1966 Practice Name: [facility] Reason for Call: LMP due to start cycle 09/28/10. Denies pregnant or lactating. Afebrile. [name]/pt calling regarding having severe pain across lower abdomen - onset 09/24/10 but has been getting progressively worse. Pt advises it's constant pain and feels like fire. All emergent sxs r/o with exception of see ED immediately d/t unbearable abdominal/pelvic pain - per Abdominal Pain guidelines. Care advice given. Pt advised isn't going to go to no ER and wants to wait to see her doctor tomorrow 09/27/10. Pt advised wants to pain medication now. S/w on call MD Crawford Givens, conferenced pt with on call MD. On call MD advised pt that she needed to be seen in the ED immediately d/t the pain she is experiencing and that he couldn't call in any pain medication. Pt wanted to know why she couldn't just wait till in the morning to be seen and the MD advised that she needed to be seen tonight and not wait till the morning and that if she did it would be going against medical advice. Pt asked MD what the problem could be and MD advised he couldn't tell her cause it could be many different things that's why she needed to be seen tonight. Protocol(s) Used: Abdominal Pain Recommended Outcome per Protocol: See ED Immediately Reason for Outcome: Unbearable abdominal/pelvic pain Care Advice: ~ IMMEDIATE ACTION 09/27/2010 1:27:57AM Page 1 of 1 [email or web address]_[email or web address]

## 2010-09-30 NOTE — Telephone Encounter (Signed)
[facility] Triage Call Report Triage Record Num: [id number] Operator: Di Kindle Patient Name: Connie Freeman Call Date & Time: 09/28/2010 2:49:38PM Patient Phone: [phone] PCP: Sanda Linger Patient Gender: Female PCP Fax : Patient DOB: 1966-03-28 [facility] Name: [facility] Reason for Call: Pt calling to report onset 3-4 days of UTI, now in lower back, blood in urine, afebrile. Guideline: bloody urine, UTI symptoms, advised needs to be seen in 24 hrs, needs to be seen in UC, pt refuses, states she will find another doctor who will call in an RX, insists on speaking with on call, Dr Para March called on cell. Pt advised she did not go to the ED as advised last night when she called 09/27/10. Pt noted to be rude when speaking with MD, insists on anti-biotic, questions why he is on call if he will not call in ant-biotic for her. MD Advised of need to be seen for these symptoms also. Protocol(s) Used: Bloody Urine Recommended Outcome per Protocol: See Provider within 4 hours Reason for Outcome: Urinary tract symptoms AND any flank, low back, lower abdominal pain, or labia, vagina or testicle/scrotum pain Care Advice: ~ Another adult should drive. ~ Call provider if you develop flank or low back pain, fever, chills, or generally feel sick. 09/28/2010 3:11:40PM Page 1 of 1 [email or web address]

## 2010-09-30 NOTE — Telephone Encounter (Signed)
[facility] Triage Call Report Triage Record Num: [id number] Operator: Edgar Frisk Patient Name: Connie Freeman Call Date & Time: 09/27/2010 1:40:34AM Patient Phone: [phone] PCP: Sanda Linger Patient Gender: Female PCP Fax : Patient DOB: 01-16-1966 [facility] Name[facility] Reason for Call: Pt calling back reports she will go to ED now for evaluation , Going to [facility] for evaluation. Protocol(s) Used: Office Note Recommended Outcome per Protocol: Information Noted and Sent to Office Reason for Outcome: Caller information to office Care Advice: ~ 09/27/2010 1:43:04AM Page 1 of 1 [email or web address]

## 2011-01-28 ENCOUNTER — Encounter: Payer: Self-pay | Admitting: Internal Medicine

## 2011-01-28 ENCOUNTER — Other Ambulatory Visit: Payer: Self-pay | Admitting: Internal Medicine

## 2011-01-28 ENCOUNTER — Ambulatory Visit (INDEPENDENT_AMBULATORY_CARE_PROVIDER_SITE_OTHER): Payer: 59 | Admitting: Internal Medicine

## 2011-01-28 ENCOUNTER — Other Ambulatory Visit (INDEPENDENT_AMBULATORY_CARE_PROVIDER_SITE_OTHER): Payer: 59

## 2011-01-28 VITALS — BP 120/80 | HR 86 | Temp 97.8°F | Resp 16 | Wt 128.0 lb

## 2011-01-28 DIAGNOSIS — J45901 Unspecified asthma with (acute) exacerbation: Secondary | ICD-10-CM

## 2011-01-28 DIAGNOSIS — Z1231 Encounter for screening mammogram for malignant neoplasm of breast: Secondary | ICD-10-CM

## 2011-01-28 DIAGNOSIS — Z0000 Encounter for general adult medical examination without abnormal findings: Secondary | ICD-10-CM

## 2011-01-28 DIAGNOSIS — Z79899 Other long term (current) drug therapy: Secondary | ICD-10-CM

## 2011-01-28 DIAGNOSIS — J309 Allergic rhinitis, unspecified: Secondary | ICD-10-CM

## 2011-01-28 LAB — URINALYSIS, ROUTINE W REFLEX MICROSCOPIC
Bilirubin Urine: NEGATIVE
Nitrite: NEGATIVE
Total Protein, Urine: NEGATIVE
Urine Glucose: NEGATIVE
pH: 6 (ref 5.0–8.0)

## 2011-01-28 LAB — COMPREHENSIVE METABOLIC PANEL
ALT: 16 U/L (ref 0–35)
AST: 18 U/L (ref 0–37)
Albumin: 3.7 g/dL (ref 3.5–5.2)
Alkaline Phosphatase: 38 U/L — ABNORMAL LOW (ref 39–117)
BUN: 9 mg/dL (ref 6–23)
Chloride: 106 mEq/L (ref 96–112)
Potassium: 4.1 mEq/L (ref 3.5–5.1)

## 2011-01-28 LAB — CBC WITH DIFFERENTIAL/PLATELET
Basophils Absolute: 0 10*3/uL (ref 0.0–0.1)
Basophils Relative: 0.3 % (ref 0.0–3.0)
Eosinophils Absolute: 0.1 10*3/uL (ref 0.0–0.7)
Lymphocytes Relative: 34.3 % (ref 12.0–46.0)
MCHC: 33.4 g/dL (ref 30.0–36.0)
MCV: 91.6 fl (ref 78.0–100.0)
Monocytes Absolute: 0.5 10*3/uL (ref 0.1–1.0)
Neutrophils Relative %: 55.9 % (ref 43.0–77.0)
Platelets: 372 10*3/uL (ref 150.0–400.0)
RBC: 4.23 Mil/uL (ref 3.87–5.11)
RDW: 13.5 % (ref 11.5–14.6)

## 2011-01-28 LAB — LIPID PANEL
Cholesterol: 226 mg/dL — ABNORMAL HIGH (ref 0–200)
HDL: 63.8 mg/dL (ref >39.00)
Triglycerides: 120 mg/dL (ref 0.0–149.0)
VLDL: 24 mg/dL (ref 0.0–40.0)

## 2011-01-28 MED ORDER — BECLOMETHASONE DIPROPIONATE 80 MCG/ACT NA AERS: 2.0000 | INHALATION_SPRAY | Freq: Once | NASAL | Status: DC

## 2011-01-28 MED ORDER — METHYLPREDNISOLONE ACETATE 80 MG/ML IJ SUSP: 120.0000 mg | Freq: Once | INTRAMUSCULAR | Status: DC

## 2011-01-28 NOTE — Assessment & Plan Note (Signed)
Depo-medrol injection today and start qnasl

## 2011-01-28 NOTE — Patient Instructions (Signed)

## 2011-01-28 NOTE — Progress Notes (Signed)
Subjective:    Patient ID: Connie Freeman, female    DOB: Oct 03, 1965, 45 y.o.   MRN: [id number]  Allergic Reaction This is a recurrent problem. Episode onset: 4 weeks. The problem occurs intermittently. The problem is unchanged. The problem is moderate. It is unknown what she was exposed to. The time of exposure is not relevant (no exposure). Associated symptoms include eye itching and eye watering. Pertinent negatives include no abdominal pain, chest pain, chest pressure, coughing, diarrhea, difficulty breathing, drooling, eye redness, globus sensation, hyperventilation, itching, rash, stridor, trouble swallowing, vomiting or wheezing. There is no swelling present. Past treatments include diphenhydramine and one or more OTC medications. The treatment provided no relief. Her past medical history is significant for asthma and seasonal allergies.      Review of Systems  Constitutional: Negative for fever, chills, diaphoresis, activity change, appetite change, fatigue and unexpected weight change.  HENT: Positive for congestion, rhinorrhea, sneezing and postnasal drip. Negative for ear pain, nosebleeds, sore throat, facial swelling, drooling, mouth sores, trouble swallowing, neck pain, neck stiffness, dental problem, voice change, sinus pressure and tinnitus.   Eyes: Positive for itching. Negative for photophobia, pain, discharge, redness and visual disturbance.  Respiratory: Negative for apnea, cough, chest tightness, shortness of breath, wheezing and stridor.   Cardiovascular: Negative for chest pain, palpitations and leg swelling.  Gastrointestinal: Negative for nausea, vomiting, abdominal pain, diarrhea, constipation, blood in stool, abdominal distention and anal bleeding.  Genitourinary: Negative for dysuria, urgency, frequency, hematuria, flank pain, decreased urine volume, vaginal bleeding, vaginal discharge, enuresis, difficulty urinating, genital sores, vaginal pain, menstrual problem,  pelvic pain and dyspareunia.  Musculoskeletal: Negative for myalgias, back pain, joint swelling, arthralgias and gait problem.  Skin: Negative for color change, itching, pallor, rash and wound.  Neurological: Negative for dizziness, tremors, seizures, syncope, facial asymmetry, speech difficulty, weakness, light-headedness, numbness and headaches.  Hematological: Negative for adenopathy. Does not bruise/bleed easily.  Psychiatric/Behavioral: Negative.        Objective:   Physical Exam  Vitals reviewed. Constitutional: She is oriented to person, place, and time. She appears well-developed and well-nourished. No distress.  HENT:  Head: Normocephalic and atraumatic. No trismus in the jaw.  Right Ear: Hearing, tympanic membrane, external ear and ear canal normal.  Left Ear: Hearing, tympanic membrane, external ear and ear canal normal.  Nose: Mucosal edema and rhinorrhea present. No nose lacerations, sinus tenderness, nasal deformity, septal deviation or nasal septal hematoma. No epistaxis.  No foreign bodies. Right sinus exhibits no maxillary sinus tenderness and no frontal sinus tenderness. Left sinus exhibits no maxillary sinus tenderness and no frontal sinus tenderness.  Mouth/Throat: Oropharynx is clear and moist and mucous membranes are normal. Mucous membranes are not pale, not dry and not cyanotic. No uvula swelling. No oropharyngeal exudate, posterior oropharyngeal edema, posterior oropharyngeal erythema or tonsillar abscesses.  Eyes: Conjunctivae are normal. Right eye exhibits no discharge. Left eye exhibits no discharge. No scleral icterus.  Neck: Normal range of motion. Neck supple. No JVD present. No tracheal deviation present. No thyromegaly present.  Cardiovascular: Normal rate, regular rhythm, normal heart sounds and intact distal pulses.  Exam reveals no gallop and no friction rub.   No murmur heard. Pulmonary/Chest: Effort normal and breath sounds normal. No stridor. No  respiratory distress. She has no decreased breath sounds. She has no wheezes. She has no rhonchi. She has no rales. She exhibits no tenderness. Right breast exhibits no inverted nipple, no mass, no nipple discharge, no skin change and no tenderness. Left  breast exhibits no inverted nipple, no mass, no nipple discharge, no skin change and no tenderness. Breasts are symmetrical.  Abdominal: Soft. Bowel sounds are normal. She exhibits no distension and no mass. There is no tenderness. There is no rebound and no guarding.  Musculoskeletal: Normal range of motion. She exhibits no edema and no tenderness.  Lymphadenopathy:    She has no cervical adenopathy.  Neurological: She is oriented to person, place, and time. She displays normal reflexes. She exhibits normal muscle tone. Coordination normal.  Skin: Skin is warm and dry. No rash noted. She is not diaphoretic. No erythema. No pallor.  Psychiatric: She has a normal mood and affect. Her behavior is normal. Judgment and thought content normal.      Lab Results  Component Value Date   WBC 5.6 11/22/2009   HGB 13.6 11/22/2009   HCT 40.6 11/22/2009   PLT 297 11/22/2009   CHOL 252 11/22/2009   HDL 81 11/22/2009   NA 137 05/22/1476   K 4.1 11/22/2009   CL 104 11/22/2009   CREATININE 0.58 11/22/2009   BUN 11 11/22/2009   CO2 27 11/22/2009   TSH 0.986 11/23/2009      Assessment & Plan:

## 2011-01-28 NOTE — Assessment & Plan Note (Signed)
Mammogram has been ordered

## 2011-01-28 NOTE — Assessment & Plan Note (Signed)
PAP smear was not done today at her request b/c she is currently menstruating, exam was done, labs ordered, pt ed material was given

## 2011-01-28 NOTE — Assessment & Plan Note (Signed)
stable °

## 2011-02-26 ENCOUNTER — Encounter: Payer: Self-pay | Admitting: *Deleted

## 2011-02-26 ENCOUNTER — Ambulatory Visit (INDEPENDENT_AMBULATORY_CARE_PROVIDER_SITE_OTHER): Payer: 59 | Admitting: *Deleted

## 2011-02-26 DIAGNOSIS — Z23 Encounter for immunization: Secondary | ICD-10-CM

## 2011-03-11 ENCOUNTER — Ambulatory Visit: Payer: 59 | Admitting: Internal Medicine

## 2011-03-11 DIAGNOSIS — Z0289 Encounter for other administrative examinations: Secondary | ICD-10-CM

## 2011-03-13 ENCOUNTER — Telehealth: Payer: Self-pay

## 2011-03-13 NOTE — Telephone Encounter (Signed)
Patient called stating that she missed last appt for mammogram. Per pt she is having pain under R breast and has a history of abnormal mammograms. Per [employer], pt will need new order for a diagnostic scan. Please advise if ok to order, pt is off the next couple of days but PCP is out of the office until 03/17/11. Thanks

## 2011-03-13 NOTE — Telephone Encounter (Signed)
She needs to see Dr Yetta Barre for an OV , so that he could examine her first Thx

## 2011-03-17 NOTE — Telephone Encounter (Signed)
Please advise, per pt has a hx of adnormal mammogram

## 2011-03-18 NOTE — Telephone Encounter (Signed)
I believe she was a no-show for her annual exam last week, she needs to be seen

## 2011-03-18 NOTE — Telephone Encounter (Signed)
Returned call to patient to advise per MD that she need to schedule a follow up appt with MD.

## 2011-06-19 ENCOUNTER — Ambulatory Visit (INDEPENDENT_AMBULATORY_CARE_PROVIDER_SITE_OTHER): Payer: 59 | Admitting: Endocrinology

## 2011-06-19 ENCOUNTER — Encounter: Payer: Self-pay | Admitting: Endocrinology

## 2011-06-19 VITALS — BP 110/78 | HR 80 | Temp 97.9°F

## 2011-06-19 DIAGNOSIS — J209 Acute bronchitis, unspecified: Secondary | ICD-10-CM

## 2011-06-19 MED ORDER — PROMETHAZINE-CODEINE 6.25-10 MG/5ML PO SYRP: 5.0000 mL | ORAL_SOLUTION | ORAL | Status: AC | PRN

## 2011-06-19 MED ORDER — CEFUROXIME AXETIL 250 MG PO TABS: 250.0000 mg | ORAL_TABLET | Freq: Two times a day (BID) | ORAL | Status: AC

## 2011-06-19 NOTE — Progress Notes (Signed)
  Subjective:    Patient ID: Connie Freeman, female    DOB: 05/06/1965, 46 y.o.   MRN: [id number]  HPI Pt states 1 week of slight prod-quality cough in the chest, and assoc wheezing. Past Medical History  Diagnosis Date  . Depression   . Allergy   . Asthma     Past Surgical History  Procedure Date  . Tonsillectomy     History   Social History  . Marital Status: Married    Spouse Name: N/A    Number of Children: N/A  . Years of Education: N/A   Occupational History  . Not on file.   Social History Main Topics  . Smoking status: Never Smoker   . Smokeless tobacco: Not on file   Comment: Regular Exercise-Yes  . Alcohol Use: No  . Drug Use: No  . Sexually Active: Not Currently   Other Topics Concern  . Not on file   Social History Narrative  . No narrative on file    Current Outpatient Prescriptions on File Prior to Visit  Medication Sig Dispense Refill  . Beclomethasone Dipropionate 80 MCG/ACT AERS Place 2 puffs into the nose once.  1 Inhaler  11  . budesonide-formoterol (SYMBICORT) 160-4.5 MCG/ACT inhaler Inhale 2 puffs into the lungs 2 (two) times daily as needed.        Current Facility-Administered Medications on File Prior to Visit  Medication Dose Route Frequency Provider Last Rate Last Dose  . methylPREDNISolone acetate (DEPO-MEDROL) injection 120 mg  120 mg Intramuscular Once Etta Grandchild, MD        No Known Allergies  Family History  Problem Relation Age of Onset  . Hypertension Other   . Hyperlipidemia Other     BP 110/78  Pulse 80  Temp(Src) 97.9 F (36.6 C) (Oral)  SpO2 97%  LMP 05/19/2011    Review of Systems She has nasal congestion.  Fever is better.  Denies earache.     Objective:   Physical Exam VITAL SIGNS:  See vs page GENERAL: no distress head: no deformity eyes: no periorbital swelling, no proptosis external nose and ears are normal mouth: no lesion seen Both tm's are slightly red LUNGS:  Clear to  auscultation.       Assessment & Plan:  Acute bronchitis, new

## 2011-06-19 NOTE — Patient Instructions (Addendum)
here is a sample of "advair-100."  take 1 puff 2x a day.  rinse mouth after using. i have sent a prescription to your pharmacy, for an antibiotic. Loratadine-d (non-prescription) will help your congestion. Here is a prescription for cough syrup I hope you feel better soon.  If you don't feel better by next week, please call back

## 2011-07-20 ENCOUNTER — Ambulatory Visit (INDEPENDENT_AMBULATORY_CARE_PROVIDER_SITE_OTHER): Payer: 59 | Admitting: Internal Medicine

## 2011-07-20 ENCOUNTER — Telehealth: Payer: Self-pay

## 2011-07-20 ENCOUNTER — Encounter: Payer: Self-pay | Admitting: Internal Medicine

## 2011-07-20 VITALS — BP 120/70 | HR 68 | Temp 98.3°F | Resp 16 | Wt 133.0 lb

## 2011-07-20 DIAGNOSIS — Z1231 Encounter for screening mammogram for malignant neoplasm of breast: Secondary | ICD-10-CM

## 2011-07-20 DIAGNOSIS — J45901 Unspecified asthma with (acute) exacerbation: Secondary | ICD-10-CM

## 2011-07-20 MED ORDER — PHENTERMINE HCL 37.5 MG PO CAPS: 37.5000 mg | ORAL_CAPSULE | ORAL | Status: AC

## 2011-07-20 NOTE — Progress Notes (Signed)
  Subjective:    Patient ID: Connie Freeman, female    DOB: 12-Jan-1966, 46 y.o.   MRN: [id number]  HPI She returns and requests an Rx for phentermine, she has been on this from another doctor for a while and does really well when she takes it for a combo of ADHD and weight reduction.   Review of Systems  Constitutional: Negative.   HENT: Negative.   Eyes: Negative.   Respiratory: Negative.   Cardiovascular: Negative.   Gastrointestinal: Negative.   Genitourinary: Negative.   Musculoskeletal: Negative.   Skin: Negative.   Neurological: Negative.   Hematological: Negative.   Psychiatric/Behavioral: Negative.        Objective:   Physical Exam  Vitals reviewed. Constitutional: She is oriented to person, place, and time. She appears well-developed and well-nourished. No distress.  HENT:  Head: Normocephalic and atraumatic.  Mouth/Throat: Oropharynx is clear and moist. No oropharyngeal exudate.  Eyes: Conjunctivae are normal. Right eye exhibits no discharge. Left eye exhibits no discharge. No scleral icterus.  Neck: Normal range of motion. Neck supple. No JVD present. No tracheal deviation present. No thyromegaly present.  Cardiovascular: Normal rate, regular rhythm, normal heart sounds and intact distal pulses.  Exam reveals no gallop and no friction rub.   No murmur heard. Pulmonary/Chest: Effort normal and breath sounds normal. No stridor. No respiratory distress. She has no wheezes. She has no rales. She exhibits no tenderness.  Abdominal: Soft. Bowel sounds are normal. She exhibits no distension and no mass. There is no tenderness. There is no rebound and no guarding.  Musculoskeletal: Normal range of motion. She exhibits no edema and no tenderness.  Lymphadenopathy:    She has no cervical adenopathy.  Neurological: She is oriented to person, place, and time.  Skin: Skin is warm and dry. No rash noted. She is not diaphoretic. No erythema. No pallor.  Psychiatric: She has a  normal mood and affect. Her behavior is normal. Judgment and thought content normal.     Lab Results  Component Value Date   WBC 6.9 01/28/2011   HGB 12.9 01/28/2011   HCT 38.7 01/28/2011   PLT 372.0 01/28/2011   GLUCOSE 82 01/28/2011   CHOL 226* 01/28/2011   TRIG 120.0 01/28/2011   HDL 63.80 01/28/2011   LDLDIRECT 150.5 01/28/2011   LDLCALC 26 11/22/2009   ALT 16 01/28/2011   AST 18 01/28/2011   NA 139 01/28/2011   K 4.1 01/28/2011   CL 106 01/28/2011   CREATININE 0.7 01/28/2011   BUN 9 01/28/2011   CO2 26 01/28/2011   TSH 0.51 01/28/2011       Assessment & Plan:

## 2011-07-20 NOTE — Assessment & Plan Note (Signed)
Continue current inhalers

## 2011-07-20 NOTE — Telephone Encounter (Signed)
ERROR

## 2011-07-20 NOTE — Patient Instructions (Signed)
Preventive Care for Adults, Female A healthy lifestyle and preventive care can promote health and wellness. Preventive health guidelines for women include the following key practices.  A routine yearly physical is a good way to check with your caregiver about your health and preventive screening. It is a chance to share any concerns and updates on your health, and to receive a thorough exam.   Visit your dentist for a routine exam and preventive care every 6 months. Brush your teeth twice a day and floss once a day. Good oral hygiene prevents tooth decay and gum disease.   The frequency of eye exams is based on your age, health, family medical history, use of contact lenses, and other factors. Follow your caregiver's recommendations for frequency of eye exams.   Eat a healthy diet. Foods like vegetables, fruits, whole grains, low-fat dairy products, and lean protein foods contain the nutrients you need without too many calories. Decrease your intake of foods high in solid fats, added sugars, and salt. Eat the right amount of calories for you.Get information about a proper diet from your caregiver, if necessary.   Regular physical exercise is one of the most important things you can do for your health. Most adults should get at least 150 minutes of moderate-intensity exercise (any activity that increases your heart rate and causes you to sweat) each week. In addition, most adults need muscle-strengthening exercises on 2 or more days a week.   Maintain a healthy weight. The body mass index (BMI) is a screening tool to identify possible weight problems. It provides an estimate of body fat based on height and weight. Your caregiver can help determine your BMI, and can help you achieve or maintain a healthy weight.For adults 20 years and older:   A BMI below 18.5 is considered underweight.   A BMI of 18.5 to 24.9 is normal.   A BMI of 25 to 29.9 is considered overweight.   A BMI of 30 and above is  considered obese.   Maintain normal blood lipids and cholesterol levels by exercising and minimizing your intake of saturated fat. Eat a balanced diet with plenty of fruit and vegetables. Blood tests for lipids and cholesterol should begin at age 20 and be repeated every 5 years. If your lipid or cholesterol levels are high, you are over 50, or you are at high risk for heart disease, you may need your cholesterol levels checked more frequently.Ongoing high lipid and cholesterol levels should be treated with medicines if diet and exercise are not effective.   If you smoke, find out from your caregiver how to quit. If you do not use tobacco, do not start.   If you are pregnant, do not drink alcohol. If you are breastfeeding, be very cautious about drinking alcohol. If you are not pregnant and choose to drink alcohol, do not exceed 1 drink per day. One drink is considered to be 12 ounces (355 mL) of beer, 5 ounces (148 mL) of wine, or 1.5 ounces (44 mL) of liquor.   Avoid use of street drugs. Do not share needles with anyone. Ask for help if you need support or instructions about stopping the use of drugs.   High blood pressure causes heart disease and increases the risk of stroke. Your blood pressure should be checked at least every 1 to 2 years. Ongoing high blood pressure should be treated with medicines if weight loss and exercise are not effective.   If you are 55 to 45   years old, ask your caregiver if you should take aspirin to prevent strokes.   Diabetes screening involves taking a blood sample to check your fasting blood sugar level. This should be done once every 3 years, after age 45, if you are within normal weight and without risk factors for diabetes. Testing should be considered at a younger age or be carried out more frequently if you are overweight and have at least 1 risk factor for diabetes.   Breast cancer screening is essential preventive care for women. You should practice "breast  self-awareness." This means understanding the normal appearance and feel of your breasts and may include breast self-examination. Any changes detected, no matter how small, should be reported to a caregiver. Women in their 20s and 30s should have a clinical breast exam (CBE) by a caregiver as part of a regular health exam every 1 to 3 years. After age 40, women should have a CBE every year. Starting at age 40, women should consider having a mammography (breast X-ray test) every year. Women who have a family history of breast cancer should talk to their caregiver about genetic screening. Women at a high risk of breast cancer should talk to their caregivers about having magnetic resonance imaging (MRI) and a mammography every year.   The Pap test is a screening test for cervical cancer. A Pap test can show cell changes on the cervix that might become cervical cancer if left untreated. A Pap test is a procedure in which cells are obtained and examined from the lower end of the uterus (cervix).   Women should have a Pap test starting at age 21.   Between ages 21 and 29, Pap tests should be repeated every 2 years.   Beginning at age 30, you should have a Pap test every 3 years as long as the past 3 Pap tests have been normal.   Some women have medical problems that increase the chance of getting cervical cancer. Talk to your caregiver about these problems. It is especially important to talk to your caregiver if a new problem develops soon after your last Pap test. In these cases, your caregiver may recommend more frequent screening and Pap tests.   The above recommendations are the same for women who have or have not gotten the vaccine for human papillomavirus (HPV).   If you had a hysterectomy for a problem that was not cancer or a condition that could lead to cancer, then you no longer need Pap tests. Even if you no longer need a Pap test, a regular exam is a good idea to make sure no other problems are  starting.   If you are between ages 65 and 70, and you have had normal Pap tests going back 10 years, you no longer need Pap tests. Even if you no longer need a Pap test, a regular exam is a good idea to make sure no other problems are starting.   If you have had past treatment for cervical cancer or a condition that could lead to cancer, you need Pap tests and screening for cancer for at least 20 years after your treatment.   If Pap tests have been discontinued, risk factors (such as a new sexual partner) need to be reassessed to determine if screening should be resumed.   The HPV test is an additional test that may be used for cervical cancer screening. The HPV test looks for the virus that can cause the cell changes on the cervix.   The cells collected during the Pap test can be tested for HPV. The HPV test could be used to screen women aged 30 years and older, and should be used in women of any age who have unclear Pap test results. After the age of 30, women should have HPV testing at the same frequency as a Pap test.   Colorectal cancer can be detected and often prevented. Most routine colorectal cancer screening begins at the age of 50 and continues through age 75. However, your caregiver may recommend screening at an earlier age if you have risk factors for colon cancer. On a yearly basis, your caregiver may provide home test kits to check for hidden blood in the stool. Use of a small camera at the end of a tube, to directly examine the colon (sigmoidoscopy or colonoscopy), can detect the earliest forms of colorectal cancer. Talk to your caregiver about this at age 50, when routine screening begins. Direct examination of the colon should be repeated every 5 to 10 years through age 75, unless early forms of pre-cancerous polyps or small growths are found.   Hepatitis C blood testing is recommended for all people born from 1945 through 1965 and any individual with known risks for hepatitis C.    Practice safe sex. Use condoms and avoid high-risk sexual practices to reduce the spread of sexually transmitted infections (STIs). STIs include gonorrhea, chlamydia, syphilis, trichomonas, herpes, HPV, and human immunodeficiency virus (HIV). Herpes, HIV, and HPV are viral illnesses that have no cure. They can result in disability, cancer, and death. Sexually active women aged 25 and younger should be checked for chlamydia. Older women with new or multiple partners should also be tested for chlamydia. Testing for other STIs is recommended if you are sexually active and at increased risk.   Osteoporosis is a disease in which the bones lose minerals and strength with aging. This can result in serious bone fractures. The risk of osteoporosis can be identified using a bone density scan. Women ages 65 and over and women at risk for fractures or osteoporosis should discuss screening with their caregivers. Ask your caregiver whether you should take a calcium supplement or vitamin D to reduce the rate of osteoporosis.   Menopause can be associated with physical symptoms and risks. Hormone replacement therapy is available to decrease symptoms and risks. You should talk to your caregiver about whether hormone replacement therapy is right for you.   Use sunscreen with sun protection factor (SPF) of 30 or more. Apply sunscreen liberally and repeatedly throughout the day. You should seek shade when your shadow is shorter than you. Protect yourself by wearing long sleeves, pants, a wide-brimmed hat, and sunglasses year round, whenever you are outdoors.   Once a month, do a whole body skin exam, using a mirror to look at the skin on your back. Notify your caregiver of new moles, moles that have irregular borders, moles that are larger than a pencil eraser, or moles that have changed in shape or color.   Stay current with required immunizations.   Influenza. You need a dose every fall (or winter). The composition of  the flu vaccine changes each year, so being vaccinated once is not enough.   Pneumococcal polysaccharide. You need 1 to 2 doses if you smoke cigarettes or if you have certain chronic medical conditions. You need 1 dose at age 65 (or older) if you have never been vaccinated.   Tetanus, diphtheria, pertussis (Tdap, Td). Get 1 dose of   Tdap vaccine if you are younger than age 65, are over 65 and have contact with an infant, are a healthcare worker, are pregnant, or simply want to be protected from whooping cough. After that, you need a Td booster dose every 10 years. Consult your caregiver if you have not had at least 3 tetanus and diphtheria-containing shots sometime in your life or have a deep or dirty wound.   HPV. You need this vaccine if you are a woman age 26 or younger. The vaccine is given in 3 doses over 6 months.   Measles, mumps, rubella (MMR). You need at least 1 dose of MMR if you were born in 1957 or later. You may also need a second dose.   Meningococcal. If you are age 19 to 21 and a first-year college student living in a residence hall, or have one of several medical conditions, you need to get vaccinated against meningococcal disease. You may also need additional booster doses.   Zoster (shingles). If you are age 60 or older, you should get this vaccine.   Varicella (chickenpox). If you have never had chickenpox or you were vaccinated but received only 1 dose, talk to your caregiver to find out if you need this vaccine.   Hepatitis A. You need this vaccine if you have a specific risk factor for hepatitis A virus infection or you simply wish to be protected from this disease. The vaccine is usually given as 2 doses, 6 to 18 months apart.   Hepatitis B. You need this vaccine if you have a specific risk factor for hepatitis B virus infection or you simply wish to be protected from this disease. The vaccine is given in 3 doses, usually over 6 months.  Preventive Services /  Frequency Ages 19 to 39  Blood pressure check.** / Every 1 to 2 years.   Lipid and cholesterol check.** / Every 5 years beginning at age 20.   Clinical breast exam.** / Every 3 years for women in their 20s and 30s.   Pap test.** / Every 2 years from ages 21 through 29. Every 3 years starting at age 30 through age 65 or 70 with a history of 3 consecutive normal Pap tests.   HPV screening.** / Every 3 years from ages 30 through ages 65 to 70 with a history of 3 consecutive normal Pap tests.   Hepatitis C blood test.** / For any individual with known risks for hepatitis C.   Skin self-exam. / Monthly.   Influenza immunization.** / Every year.   Pneumococcal polysaccharide immunization.** / 1 to 2 doses if you smoke cigarettes or if you have certain chronic medical conditions.   Tetanus, diphtheria, pertussis (Tdap, Td) immunization. / A one-time dose of Tdap vaccine. After that, you need a Td booster dose every 10 years.   HPV immunization. / 3 doses over 6 months, if you are 26 and younger.   Measles, mumps, rubella (MMR) immunization. / You need at least 1 dose of MMR if you were born in 1957 or later. You may also need a second dose.   Meningococcal immunization. / 1 dose if you are age 19 to 21 and a first-year college student living in a residence hall, or have one of several medical conditions, you need to get vaccinated against meningococcal disease. You may also need additional booster doses.   Varicella immunization.** / Consult your caregiver.   Hepatitis A immunization.** / Consult your caregiver. 2 doses, 6 to 18 months   apart.   Hepatitis B immunization.** / Consult your caregiver. 3 doses usually over 6 months.  Ages 40 to 64  Blood pressure check.** / Every 1 to 2 years.   Lipid and cholesterol check.** / Every 5 years beginning at age 20.   Clinical breast exam.** / Every year after age 40.   Mammogram.** / Every year beginning at age 40 and continuing for as  long as you are in good health. Consult with your caregiver.   Pap test.** / Every 3 years starting at age 30 through age 65 or 70 with a history of 3 consecutive normal Pap tests.   HPV screening.** / Every 3 years from ages 30 through ages 65 to 70 with a history of 3 consecutive normal Pap tests.   Fecal occult blood test (FOBT) of stool. / Every year beginning at age 50 and continuing until age 75. You may not need to do this test if you get a colonoscopy every 10 years.   Flexible sigmoidoscopy or colonoscopy.** / Every 5 years for a flexible sigmoidoscopy or every 10 years for a colonoscopy beginning at age 50 and continuing until age 75.   Hepatitis C blood test.** / For all people born from 1945 through 1965 and any individual with known risks for hepatitis C.   Skin self-exam. / Monthly.   Influenza immunization.** / Every year.   Pneumococcal polysaccharide immunization.** / 1 to 2 doses if you smoke cigarettes or if you have certain chronic medical conditions.   Tetanus, diphtheria, pertussis (Tdap, Td) immunization.** / A one-time dose of Tdap vaccine. After that, you need a Td booster dose every 10 years.   Measles, mumps, rubella (MMR) immunization. / You need at least 1 dose of MMR if you were born in 1957 or later. You may also need a second dose.   Varicella immunization.** / Consult your caregiver.   Meningococcal immunization.** / Consult your caregiver.   Hepatitis A immunization.** / Consult your caregiver. 2 doses, 6 to 18 months apart.   Hepatitis B immunization.** / Consult your caregiver. 3 doses, usually over 6 months.  Ages 65 and over  Blood pressure check.** / Every 1 to 2 years.   Lipid and cholesterol check.** / Every 5 years beginning at age 20.   Clinical breast exam.** / Every year after age 40.   Mammogram.** / Every year beginning at age 40 and continuing for as long as you are in good health. Consult with your caregiver.   Pap test.** /  Every 3 years starting at age 30 through age 65 or 70 with a 3 consecutive normal Pap tests. Testing can be stopped between 65 and 70 with 3 consecutive normal Pap tests and no abnormal Pap or HPV tests in the past 10 years.   HPV screening.** / Every 3 years from ages 30 through ages 65 or 70 with a history of 3 consecutive normal Pap tests. Testing can be stopped between 65 and 70 with 3 consecutive normal Pap tests and no abnormal Pap or HPV tests in the past 10 years.   Fecal occult blood test (FOBT) of stool. / Every year beginning at age 50 and continuing until age 75. You may not need to do this test if you get a colonoscopy every 10 years.   Flexible sigmoidoscopy or colonoscopy.** / Every 5 years for a flexible sigmoidoscopy or every 10 years for a colonoscopy beginning at age 50 and continuing until age 75.   Hepatitis   C blood test.** / For all people born from 1945 through 1965 and any individual with known risks for hepatitis C.   Osteoporosis screening.** / A one-time screening for women ages 65 and over and women at risk for fractures or osteoporosis.   Skin self-exam. / Monthly.   Influenza immunization.** / Every year.   Pneumococcal polysaccharide immunization.** / 1 dose at age 65 (or older) if you have never been vaccinated.   Tetanus, diphtheria, pertussis (Tdap, Td) immunization. / A one-time dose of Tdap vaccine if you are over 65 and have contact with an infant, are a healthcare worker, or simply want to be protected from whooping cough. After that, you need a Td booster dose every 10 years.   Varicella immunization.** / Consult your caregiver.   Meningococcal immunization.** / Consult your caregiver.   Hepatitis A immunization.** / Consult your caregiver. 2 doses, 6 to 18 months apart.   Hepatitis B immunization.** / Check with your caregiver. 3 doses, usually over 6 months.  ** Family history and personal history of risk and conditions may change your caregiver's  recommendations. Document Released: 06/16/2001 Document Revised: 04/09/2011 Document Reviewed: 09/15/2010 [employer] Patient Information 2012 [employer] 

## 2011-07-29 ENCOUNTER — Telehealth: Payer: Self-pay

## 2011-07-29 NOTE — Telephone Encounter (Signed)
Scheduler transferred call stating that pt was returning my phone call that I made several times. I advised scheduler that I have not called here today but I will be glad to speak with her. I pulled up pt chart and advised that I called on 07/20/11 in error and that I apologized. I asked patient if I could help her with something, pt then stated " No you called me, I did not call you. My son heard you call me this am x 2". I informed pt that I was in late this am and indeed do not call her. Pt became fussy stating that she can obtain detailed phone records which can prove it. At that point I offered pt to speak with manager to avoid any further conflict. Patient was then transferred to manager VM. I researched pt chart and did not see anything that would have warranted the phone call.

## 2012-03-07 ENCOUNTER — Other Ambulatory Visit (INDEPENDENT_AMBULATORY_CARE_PROVIDER_SITE_OTHER): Payer: 59

## 2012-03-07 ENCOUNTER — Encounter: Payer: Self-pay | Admitting: Internal Medicine

## 2012-03-07 ENCOUNTER — Other Ambulatory Visit (HOSPITAL_COMMUNITY)
Admission: RE | Admit: 2012-03-07 | Discharge: 2012-03-07 | Disposition: A | Discharge status: Home / Self Care | Payer: 59 | Source: Ambulatory Visit | Attending: Internal Medicine | Admitting: Internal Medicine

## 2012-03-07 ENCOUNTER — Ambulatory Visit (INDEPENDENT_AMBULATORY_CARE_PROVIDER_SITE_OTHER): Payer: 59 | Admitting: Internal Medicine

## 2012-03-07 VITALS — BP 90/68 | HR 78 | Temp 98.5°F | Resp 16 | Ht 64.0 in | Wt 138.0 lb

## 2012-03-07 DIAGNOSIS — N842 Polyp of vagina: Secondary | ICD-10-CM | POA: Insufficient documentation

## 2012-03-07 DIAGNOSIS — Z23 Encounter for immunization: Secondary | ICD-10-CM

## 2012-03-07 DIAGNOSIS — Z0000 Encounter for general adult medical examination without abnormal findings: Secondary | ICD-10-CM

## 2012-03-07 DIAGNOSIS — Z1231 Encounter for screening mammogram for malignant neoplasm of breast: Secondary | ICD-10-CM

## 2012-03-07 DIAGNOSIS — M25552 Pain in left hip: Secondary | ICD-10-CM | POA: Insufficient documentation

## 2012-03-07 DIAGNOSIS — Z124 Encounter for screening for malignant neoplasm of cervix: Secondary | ICD-10-CM | POA: Insufficient documentation

## 2012-03-07 DIAGNOSIS — Z01419 Encounter for gynecological examination (general) (routine) without abnormal findings: Secondary | ICD-10-CM | POA: Insufficient documentation

## 2012-03-07 LAB — CBC WITH DIFFERENTIAL/PLATELET
Basophils Absolute: 0 10*3/uL (ref 0.0–0.1)
Eosinophils Absolute: 0.2 10*3/uL (ref 0.0–0.7)
Hemoglobin: 14.2 g/dL (ref 12.0–15.0)
Lymphocytes Relative: 27.2 % (ref 12.0–46.0)
MCHC: 33.3 g/dL (ref 30.0–36.0)
Neutro Abs: 4.1 10*3/uL (ref 1.4–7.7)
Neutrophils Relative %: 60.1 % (ref 43.0–77.0)
Platelets: 304 10*3/uL (ref 150.0–400.0)
RDW: 13.8 % (ref 11.5–14.6)

## 2012-03-07 LAB — COMPREHENSIVE METABOLIC PANEL
ALT: 18 U/L (ref 0–35)
AST: 22 U/L (ref 0–37)
Albumin: 4.1 g/dL (ref 3.5–5.2)
Calcium: 9 mg/dL (ref 8.4–10.5)
Chloride: 104 mEq/L (ref 96–112)
Potassium: 4.2 mEq/L (ref 3.5–5.1)
Sodium: 136 mEq/L (ref 135–145)
Total Protein: 7.5 g/dL (ref 6.0–8.3)

## 2012-03-07 LAB — LIPID PANEL
Total CHOL/HDL Ratio: 3
VLDL: 25 mg/dL (ref 0.0–40.0)

## 2012-03-07 LAB — LDL CHOLESTEROL, DIRECT: Direct LDL: 129.9 mg/dL

## 2012-03-07 NOTE — Patient Instructions (Signed)
Preventive Care for Adults, Female A healthy lifestyle and preventive care can promote health and wellness. Preventive health guidelines for women include the following key practices.  A routine yearly physical is a good way to check with your caregiver about your health and preventive screening. It is a chance to share any concerns and updates on your health, and to receive a thorough exam.  Visit your dentist for a routine exam and preventive care every 6 months. Brush your teeth twice a day and floss once a day. Good oral hygiene prevents tooth decay and gum disease.  The frequency of eye exams is based on your age, health, family medical history, use of contact lenses, and other factors. Follow your caregiver's recommendations for frequency of eye exams.  Eat a healthy diet. Foods like vegetables, fruits, whole grains, low-fat dairy products, and lean protein foods contain the nutrients you need without too many calories. Decrease your intake of foods high in solid fats, added sugars, and salt. Eat the right amount of calories for you.Get information about a proper diet from your caregiver, if necessary.  Regular physical exercise is one of the most important things you can do for your health. Most adults should get at least 150 minutes of moderate-intensity exercise (any activity that increases your heart rate and causes you to sweat) each week. In addition, most adults need muscle-strengthening exercises on 2 or more days a week.  Maintain a healthy weight. The body mass index (BMI) is a screening tool to identify possible weight problems. It provides an estimate of body fat based on height and weight. Your caregiver can help determine your BMI, and can help you achieve or maintain a healthy weight.For adults 20 years and older:  A BMI below 18.5 is considered underweight.  A BMI of 18.5 to 24.9 is normal.  A BMI of 25 to 29.9 is considered overweight.  A BMI of 30 and above is  considered obese.  Maintain normal blood lipids and cholesterol levels by exercising and minimizing your intake of saturated fat. Eat a balanced diet with plenty of fruit and vegetables. Blood tests for lipids and cholesterol should begin at age 20 and be repeated every 5 years. If your lipid or cholesterol levels are high, you are over 50, or you are at high risk for heart disease, you may need your cholesterol levels checked more frequently.Ongoing high lipid and cholesterol levels should be treated with medicines if diet and exercise are not effective.  If you smoke, find out from your caregiver how to quit. If you do not use tobacco, do not start.  If you are pregnant, do not drink alcohol. If you are breastfeeding, be very cautious about drinking alcohol. If you are not pregnant and choose to drink alcohol, do not exceed 1 drink per day. One drink is considered to be 12 ounces (355 mL) of beer, 5 ounces (148 mL) of wine, or 1.5 ounces (44 mL) of liquor.  Avoid use of street drugs. Do not share needles with anyone. Ask for help if you need support or instructions about stopping the use of drugs.  High blood pressure causes heart disease and increases the risk of stroke. Your blood pressure should be checked at least every 1 to 2 years. Ongoing high blood pressure should be treated with medicines if weight loss and exercise are not effective.  If you are 55 to 45 years old, ask your caregiver if you should take aspirin to prevent strokes.  Diabetes   screening involves taking a blood sample to check your fasting blood sugar level. This should be done once every 3 years, after age 45, if you are within normal weight and without risk factors for diabetes. Testing should be considered at a younger age or be carried out more frequently if you are overweight and have at least 1 risk factor for diabetes.  Breast cancer screening is essential preventive care for women. You should practice "breast  self-awareness." This means understanding the normal appearance and feel of your breasts and may include breast self-examination. Any changes detected, no matter how small, should be reported to a caregiver. Women in their 20s and 30s should have a clinical breast exam (CBE) by a caregiver as part of a regular health exam every 1 to 3 years. After age 40, women should have a CBE every year. Starting at age 40, women should consider having a mammography (breast X-ray test) every year. Women who have a family history of breast cancer should talk to their caregiver about genetic screening. Women at a high risk of breast cancer should talk to their caregivers about having magnetic resonance imaging (MRI) and a mammography every year.  The Pap test is a screening test for cervical cancer. A Pap test can show cell changes on the cervix that might become cervical cancer if left untreated. A Pap test is a procedure in which cells are obtained and examined from the lower end of the uterus (cervix).  Women should have a Pap test starting at age 21.  Between ages 21 and 29, Pap tests should be repeated every 2 years.  Beginning at age 30, you should have a Pap test every 3 years as long as the past 3 Pap tests have been normal.  Some women have medical problems that increase the chance of getting cervical cancer. Talk to your caregiver about these problems. It is especially important to talk to your caregiver if a new problem develops soon after your last Pap test. In these cases, your caregiver may recommend more frequent screening and Pap tests.  The above recommendations are the same for women who have or have not gotten the vaccine for human papillomavirus (HPV).  If you had a hysterectomy for a problem that was not cancer or a condition that could lead to cancer, then you no longer need Pap tests. Even if you no longer need a Pap test, a regular exam is a good idea to make sure no other problems are  starting.  If you are between ages 65 and 70, and you have had normal Pap tests going back 10 years, you no longer need Pap tests. Even if you no longer need a Pap test, a regular exam is a good idea to make sure no other problems are starting.  If you have had past treatment for cervical cancer or a condition that could lead to cancer, you need Pap tests and screening for cancer for at least 20 years after your treatment.  If Pap tests have been discontinued, risk factors (such as a new sexual partner) need to be reassessed to determine if screening should be resumed.  The HPV test is an additional test that may be used for cervical cancer screening. The HPV test looks for the virus that can cause the cell changes on the cervix. The cells collected during the Pap test can be tested for HPV. The HPV test could be used to screen women aged 30 years and older, and should   be used in women of any age who have unclear Pap test results. After the age of 30, women should have HPV testing at the same frequency as a Pap test.  Colorectal cancer can be detected and often prevented. Most routine colorectal cancer screening begins at the age of 50 and continues through age 75. However, your caregiver may recommend screening at an earlier age if you have risk factors for colon cancer. On a yearly basis, your caregiver may provide home test kits to check for hidden blood in the stool. Use of a small camera at the end of a tube, to directly examine the colon (sigmoidoscopy or colonoscopy), can detect the earliest forms of colorectal cancer. Talk to your caregiver about this at age 50, when routine screening begins. Direct examination of the colon should be repeated every 5 to 10 years through age 75, unless early forms of pre-cancerous polyps or small growths are found.  Hepatitis C blood testing is recommended for all people born from 1945 through 1965 and any individual with known risks for hepatitis C.  Practice  safe sex. Use condoms and avoid high-risk sexual practices to reduce the spread of sexually transmitted infections (STIs). STIs include gonorrhea, chlamydia, syphilis, trichomonas, herpes, HPV, and human immunodeficiency virus (HIV). Herpes, HIV, and HPV are viral illnesses that have no cure. They can result in disability, cancer, and death. Sexually active women aged 25 and younger should be checked for chlamydia. Older women with new or multiple partners should also be tested for chlamydia. Testing for other STIs is recommended if you are sexually active and at increased risk.  Osteoporosis is a disease in which the bones lose minerals and strength with aging. This can result in serious bone fractures. The risk of osteoporosis can be identified using a bone density scan. Women ages 65 and over and women at risk for fractures or osteoporosis should discuss screening with their caregivers. Ask your caregiver whether you should take a calcium supplement or vitamin D to reduce the rate of osteoporosis.  Menopause can be associated with physical symptoms and risks. Hormone replacement therapy is available to decrease symptoms and risks. You should talk to your caregiver about whether hormone replacement therapy is right for you.  Use sunscreen with sun protection factor (SPF) of 30 or more. Apply sunscreen liberally and repeatedly throughout the day. You should seek shade when your shadow is shorter than you. Protect yourself by wearing long sleeves, pants, a wide-brimmed hat, and sunglasses year round, whenever you are outdoors.  Once a month, do a whole body skin exam, using a mirror to look at the skin on your back. Notify your caregiver of new moles, moles that have irregular borders, moles that are larger than a pencil eraser, or moles that have changed in shape or color.  Stay current with required immunizations.  Influenza. You need a dose every fall (or winter). The composition of the flu vaccine  changes each year, so being vaccinated once is not enough.  Pneumococcal polysaccharide. You need 1 to 2 doses if you smoke cigarettes or if you have certain chronic medical conditions. You need 1 dose at age 65 (or older) if you have never been vaccinated.  Tetanus, diphtheria, pertussis (Tdap, Td). Get 1 dose of Tdap vaccine if you are younger than age 65, are over 65 and have contact with an infant, are a healthcare worker, are pregnant, or simply want to be protected from whooping cough. After that, you need a Td   booster dose every 10 years. Consult your caregiver if you have not had at least 3 tetanus and diphtheria-containing shots sometime in your life or have a deep or dirty wound.  HPV. You need this vaccine if you are a woman age 26 or younger. The vaccine is given in 3 doses over 6 months.  Measles, mumps, rubella (MMR). You need at least 1 dose of MMR if you were born in 1957 or later. You may also need a second dose.  Meningococcal. If you are age 19 to 21 and a first-year college student living in a residence hall, or have one of several medical conditions, you need to get vaccinated against meningococcal disease. You may also need additional booster doses.  Zoster (shingles). If you are age 60 or older, you should get this vaccine.  Varicella (chickenpox). If you have never had chickenpox or you were vaccinated but received only 1 dose, talk to your caregiver to find out if you need this vaccine.  Hepatitis A. You need this vaccine if you have a specific risk factor for hepatitis A virus infection or you simply wish to be protected from this disease. The vaccine is usually given as 2 doses, 6 to 18 months apart.  Hepatitis B. You need this vaccine if you have a specific risk factor for hepatitis B virus infection or you simply wish to be protected from this disease. The vaccine is given in 3 doses, usually over 6 months. Preventive Services / Frequency Ages 19 to 39  Blood  pressure check.** / Every 1 to 2 years.  Lipid and cholesterol check.** / Every 5 years beginning at age 20.  Clinical breast exam.** / Every 3 years for women in their 20s and 30s.  Pap test.** / Every 2 years from ages 21 through 29. Every 3 years starting at age 30 through age 65 or 70 with a history of 3 consecutive normal Pap tests.  HPV screening.** / Every 3 years from ages 30 through ages 65 to 70 with a history of 3 consecutive normal Pap tests.  Hepatitis C blood test.** / For any individual with known risks for hepatitis C.  Skin self-exam. / Monthly.  Influenza immunization.** / Every year.  Pneumococcal polysaccharide immunization.** / 1 to 2 doses if you smoke cigarettes or if you have certain chronic medical conditions.  Tetanus, diphtheria, pertussis (Tdap, Td) immunization. / A one-time dose of Tdap vaccine. After that, you need a Td booster dose every 10 years.  HPV immunization. / 3 doses over 6 months, if you are 26 and younger.  Measles, mumps, rubella (MMR) immunization. / You need at least 1 dose of MMR if you were born in 1957 or later. You may also need a second dose.  Meningococcal immunization. / 1 dose if you are age 19 to 21 and a first-year college student living in a residence hall, or have one of several medical conditions, you need to get vaccinated against meningococcal disease. You may also need additional booster doses.  Varicella immunization.** / Consult your caregiver.  Hepatitis A immunization.** / Consult your caregiver. 2 doses, 6 to 18 months apart.  Hepatitis B immunization.** / Consult your caregiver. 3 doses usually over 6 months. Ages 40 to 64  Blood pressure check.** / Every 1 to 2 years.  Lipid and cholesterol check.** / Every 5 years beginning at age 20.  Clinical breast exam.** / Every year after age 40.  Mammogram.** / Every year beginning at age 40   and continuing for as long as you are in good health. Consult with your  caregiver.  Pap test.** / Every 3 years starting at age 30 through age 65 or 70 with a history of 3 consecutive normal Pap tests.  HPV screening.** / Every 3 years from ages 30 through ages 65 to 70 with a history of 3 consecutive normal Pap tests.  Fecal occult blood test (FOBT) of stool. / Every year beginning at age 50 and continuing until age 75. You may not need to do this test if you get a colonoscopy every 10 years.  Flexible sigmoidoscopy or colonoscopy.** / Every 5 years for a flexible sigmoidoscopy or every 10 years for a colonoscopy beginning at age 50 and continuing until age 75.  Hepatitis C blood test.** / For all people born from 1945 through 1965 and any individual with known risks for hepatitis C.  Skin self-exam. / Monthly.  Influenza immunization.** / Every year.  Pneumococcal polysaccharide immunization.** / 1 to 2 doses if you smoke cigarettes or if you have certain chronic medical conditions.  Tetanus, diphtheria, pertussis (Tdap, Td) immunization.** / A one-time dose of Tdap vaccine. After that, you need a Td booster dose every 10 years.  Measles, mumps, rubella (MMR) immunization. / You need at least 1 dose of MMR if you were born in 1957 or later. You may also need a second dose.  Varicella immunization.** / Consult your caregiver.  Meningococcal immunization.** / Consult your caregiver.  Hepatitis A immunization.** / Consult your caregiver. 2 doses, 6 to 18 months apart.  Hepatitis B immunization.** / Consult your caregiver. 3 doses, usually over 6 months. Ages 65 and over  Blood pressure check.** / Every 1 to 2 years.  Lipid and cholesterol check.** / Every 5 years beginning at age 20.  Clinical breast exam.** / Every year after age 40.  Mammogram.** / Every year beginning at age 40 and continuing for as long as you are in good health. Consult with your caregiver.  Pap test.** / Every 3 years starting at age 30 through age 65 or 70 with a 3  consecutive normal Pap tests. Testing can be stopped between 65 and 70 with 3 consecutive normal Pap tests and no abnormal Pap or HPV tests in the past 10 years.  HPV screening.** / Every 3 years from ages 30 through ages 65 or 70 with a history of 3 consecutive normal Pap tests. Testing can be stopped between 65 and 70 with 3 consecutive normal Pap tests and no abnormal Pap or HPV tests in the past 10 years.  Fecal occult blood test (FOBT) of stool. / Every year beginning at age 50 and continuing until age 75. You may not need to do this test if you get a colonoscopy every 10 years.  Flexible sigmoidoscopy or colonoscopy.** / Every 5 years for a flexible sigmoidoscopy or every 10 years for a colonoscopy beginning at age 50 and continuing until age 75.  Hepatitis C blood test.** / For all people born from 1945 through 1965 and any individual with known risks for hepatitis C.  Osteoporosis screening.** / A one-time screening for women ages 65 and over and women at risk for fractures or osteoporosis.  Skin self-exam. / Monthly.  Influenza immunization.** / Every year.  Pneumococcal polysaccharide immunization.** / 1 dose at age 65 (or older) if you have never been vaccinated.  Tetanus, diphtheria, pertussis (Tdap, Td) immunization. / A one-time dose of Tdap vaccine if you are over   65 and have contact with an infant, are a healthcare worker, or simply want to be protected from whooping cough. After that, you need a Td booster dose every 10 years.  Varicella immunization.** / Consult your caregiver.  Meningococcal immunization.** / Consult your caregiver.  Hepatitis A immunization.** / Consult your caregiver. 2 doses, 6 to 18 months apart.  Hepatitis B immunization.** / Check with your caregiver. 3 doses, usually over 6 months. ** Family history and personal history of risk and conditions may change your caregiver's recommendations. Document Released: 06/16/2001 Document Revised: 07/13/2011  Document Reviewed: 09/15/2010 [employer] Patient Information 2013 [employer].  

## 2012-03-07 NOTE — Assessment & Plan Note (Signed)
GYN referral 

## 2012-03-07 NOTE — Assessment & Plan Note (Signed)
Exam done, vaccines were updated, labs ordered, pt ed material was given 

## 2012-03-07 NOTE — Assessment & Plan Note (Signed)
ORTHO referral

## 2012-03-07 NOTE — Progress Notes (Signed)
  Subjective:    Patient ID: Connie Freeman, female    DOB: [birth year], 46 y.o.   MRN: [id number]  HPI  She comes in today for a physical - she has had a vaginal discharge and worries that she has endometrial cancer.  Review of Systems  Constitutional: Negative.   HENT: Negative.   Eyes: Negative.   Respiratory: Negative.   Cardiovascular: Negative.   Gastrointestinal: Negative.   Genitourinary: Positive for vaginal discharge (clear,odorless). Negative for dysuria, urgency, frequency, hematuria, flank pain, decreased urine volume, vaginal bleeding, enuresis, difficulty urinating, genital sores, vaginal pain, menstrual problem, pelvic pain and dyspareunia.  Musculoskeletal: Positive for arthralgias (left hip pain). Negative for myalgias, back pain, joint swelling and gait problem.  Skin: Negative.   Neurological: Negative.   Hematological: Negative.   Psychiatric/Behavioral: Negative.        Objective:   Physical Exam  Vitals reviewed. Constitutional: She is oriented to person, place, and time. She appears well-developed and well-nourished. No distress.  HENT:  Head: Normocephalic and atraumatic.  Mouth/Throat: Oropharynx is clear and moist. No oropharyngeal exudate.  Eyes: Conjunctivae normal are normal. Right eye exhibits no discharge. Left eye exhibits no discharge. No scleral icterus.  Neck: Normal range of motion. Neck supple. No JVD present. No tracheal deviation present. No thyromegaly present.  Cardiovascular: Normal rate, regular rhythm, normal heart sounds and intact distal pulses.  Exam reveals no gallop and no friction rub.   No murmur heard. Pulmonary/Chest: Effort normal and breath sounds normal. No stridor. No respiratory distress. She has no wheezes. She has no rales. She exhibits no tenderness.  Abdominal: Soft. Bowel sounds are normal. She exhibits no distension and no mass. There is no tenderness. There is no rebound and no guarding. Hernia confirmed negative in  the right inguinal area and confirmed negative in the left inguinal area.  Genitourinary: No breast swelling, tenderness, discharge or bleeding. Pelvic exam was performed with patient supine. No labial fusion. There is no rash, tenderness, lesion or injury on the right labia. There is no rash, tenderness, lesion or injury on the left labia. No erythema, tenderness or bleeding around the vagina. There is a foreign body (polyp felt on right vaginal wall) around the vagina. No signs of injury around the vagina. No vaginal discharge found.  Musculoskeletal: Normal range of motion. She exhibits no edema and no tenderness.  Lymphadenopathy:    She has no cervical adenopathy.       Right: No inguinal adenopathy present.       Left: No inguinal adenopathy present.  Neurological: She is oriented to person, place, and time.  Skin: Skin is warm and dry. No rash noted. She is not diaphoretic. No erythema. No pallor.  Psychiatric: She has a normal mood and affect. Her behavior is normal. Judgment and thought content normal.      Lab Results  Component Value Date   WBC 6.9 01/28/2011   HGB 12.9 01/28/2011   HCT 38.7 01/28/2011   PLT 372.0 01/28/2011   GLUCOSE 82 01/28/2011   CHOL 226* 01/28/2011   TRIG 120.0 01/28/2011   HDL 63.80 01/28/2011   LDLDIRECT 150.5 01/28/2011   LDLCALC 26 11/22/2009   ALT 16 01/28/2011   AST 18 01/28/2011   NA 139 01/28/2011   K 4.1 01/28/2011   CL 106 01/28/2011   CREATININE 0.7 01/28/2011   BUN 9 01/28/2011   CO2 26 01/28/2011   TSH 0.51 01/28/2011      Assessment & Plan:

## 2012-03-10 ENCOUNTER — Encounter: Payer: Self-pay | Admitting: Internal Medicine

## 2012-03-28 ENCOUNTER — Ambulatory Visit (HOSPITAL_COMMUNITY): Payer: 59

## 2012-03-28 ENCOUNTER — Ambulatory Visit (HOSPITAL_COMMUNITY)
Admission: RE | Admit: 2012-03-28 | Discharge: 2012-03-28 | Disposition: A | Discharge status: Home / Self Care | Payer: 59 | Source: Ambulatory Visit | Attending: Internal Medicine | Admitting: Internal Medicine

## 2012-03-28 DIAGNOSIS — Z1231 Encounter for screening mammogram for malignant neoplasm of breast: Secondary | ICD-10-CM | POA: Insufficient documentation

## 2012-05-23 ENCOUNTER — Ambulatory Visit (INDEPENDENT_AMBULATORY_CARE_PROVIDER_SITE_OTHER): Payer: 59 | Admitting: Internal Medicine

## 2012-05-23 ENCOUNTER — Encounter: Payer: Self-pay | Admitting: Internal Medicine

## 2012-05-23 ENCOUNTER — Telehealth: Payer: Self-pay | Admitting: Internal Medicine

## 2012-05-23 ENCOUNTER — Ambulatory Visit (INDEPENDENT_AMBULATORY_CARE_PROVIDER_SITE_OTHER)
Admission: RE | Admit: 2012-05-23 | Discharge: 2012-05-23 | Disposition: A | Discharge status: Home / Self Care | Payer: 59 | Source: Ambulatory Visit | Attending: Internal Medicine | Admitting: Internal Medicine

## 2012-05-23 VITALS — BP 112/64 | HR 108 | Temp 98.0°F | Resp 16 | Wt 136.0 lb

## 2012-05-23 DIAGNOSIS — R05 Cough: Secondary | ICD-10-CM

## 2012-05-23 DIAGNOSIS — J209 Acute bronchitis, unspecified: Secondary | ICD-10-CM

## 2012-05-23 MED ORDER — HYDROCOD POLST-CPM POLST ER 10-8 MG PO CP12: 1.0000 | ORAL_CAPSULE | Freq: Two times a day (BID) | ORAL | Status: DC | PRN

## 2012-05-23 MED ORDER — CEFUROXIME AXETIL 500 MG PO TABS: 500.0000 mg | ORAL_TABLET | Freq: Two times a day (BID) | ORAL | Status: DC

## 2012-05-23 MED ORDER — AZITHROMYCIN 500 MG PO TABS: 500.0000 mg | ORAL_TABLET | Freq: Every day | ORAL | Status: DC

## 2012-05-23 NOTE — Telephone Encounter (Signed)
Patient was seen today and when she went to the pharmacy they had received all of her medications except her cough medicine and she would like that to be called in again for her

## 2012-05-23 NOTE — Telephone Encounter (Signed)
Done

## 2012-05-23 NOTE — Telephone Encounter (Signed)
Patient saw Dr. Yetta Barre 05/23/12

## 2012-05-23 NOTE — Progress Notes (Signed)
  Subjective:    Patient ID: Connie Freeman, female    DOB: June 30, 1965, 47 y.o.   MRN: [id number]  Cough This is a new problem. The current episode started in the past 7 days. The problem has been gradually worsening. The problem occurs every few hours. The cough is non-productive. Associated symptoms include chills, a fever, myalgias, a sore throat and shortness of breath. Pertinent negatives include no chest pain, ear congestion, ear pain, headaches, hemoptysis, nasal congestion, postnasal drip, rash, rhinorrhea, sweats, weight loss or wheezing. She has tried OTC cough suppressant for the symptoms. The treatment provided mild relief. Her past medical history is significant for asthma.      Review of Systems  Constitutional: Positive for fever, chills and fatigue. Negative for weight loss, diaphoresis, activity change, appetite change and unexpected weight change.  HENT: Positive for sore throat. Negative for ear pain, rhinorrhea and postnasal drip.   Eyes: Negative.   Respiratory: Positive for cough and shortness of breath. Negative for apnea, hemoptysis, choking, chest tightness, wheezing and stridor.   Cardiovascular: Negative for chest pain, palpitations and leg swelling.  Gastrointestinal: Negative.   Genitourinary: Negative.   Musculoskeletal: Positive for myalgias. Negative for back pain, joint swelling, arthralgias and gait problem.  Skin: Negative for rash.  Neurological: Negative.  Negative for dizziness, weakness, light-headedness and headaches.  Hematological: Negative for adenopathy. Does not bruise/bleed easily.  Psychiatric/Behavioral: Negative.        Objective:   Physical Exam  Constitutional: She is oriented to person, place, and time. She appears well-developed and well-nourished.  Non-toxic appearance. She does not have a sickly appearance. She does not appear ill. No distress.  HENT:  Head: Normocephalic and atraumatic.  Mouth/Throat: Oropharynx is clear and  moist. No oropharyngeal exudate.  Eyes: Conjunctivae normal are normal. Right eye exhibits no discharge. Left eye exhibits no discharge. No scleral icterus.  Neck: Normal range of motion. Neck supple. No JVD present. No tracheal deviation present. No thyromegaly present.  Cardiovascular: Regular rhythm, S1 normal, S2 normal, normal heart sounds and intact distal pulses.  Tachycardia present.  Exam reveals no gallop.   No murmur heard. Pulmonary/Chest: Effort normal and breath sounds normal. No stridor. No respiratory distress. She has no wheezes. She has no rales. She exhibits no tenderness.  Abdominal: Soft. Bowel sounds are normal. She exhibits no distension and no mass. There is no tenderness. There is no rebound and no guarding.  Musculoskeletal: Normal range of motion. She exhibits no edema and no tenderness.  Lymphadenopathy:    She has no cervical adenopathy.  Neurological: She is oriented to person, place, and time.  Skin: Skin is warm and dry. No rash noted. She is not diaphoretic. No erythema. No pallor.  Psychiatric: She has a normal mood and affect. Her behavior is normal. Judgment and thought content normal.          Assessment & Plan:

## 2012-05-23 NOTE — Patient Instructions (Signed)

## 2012-05-23 NOTE — Assessment & Plan Note (Signed)
Her CXR is + for PNA so she will start ceftin and zpak She will take tussicaps for the cough

## 2012-05-23 NOTE — Assessment & Plan Note (Signed)
Start zpak and ceftin for the infection Tussicaps for the cough

## 2012-05-23 NOTE — Telephone Encounter (Signed)
[facility] Triage Call Report Triage Record Num: [id number] Operator: Valene Bors Patient Name: Connie Freeman Call Date & Time: 05/22/2012 11:38:21AM Patient Phone: [phone] PCP: Sanda Linger Patient Gender: Female PCP Fax : Patient DOB: 07/02/1965 Practice Name: [facility] Reason for Call: Caller: Connie Freeman/Patient; PCP: Sanda Linger (Adults only); CB#: [phone]; Call regarding starting with congestion, chest tightness, and body aches 4 nights ago. She has been having dull headaches on and off ever since. Started taking Sudafed every 4 hours and it helps with head pain. Loose nonproductive Cough -onset 05/20/12. Today voice is hoarse and can't lay flat d/t increased cough. Triage and Care advice per Cough Protocol and appointment advised within 24 hours for "gradual onset of cough when lying down AND having to sleep on more than one pillow or with head of bed elevated". Appointment scheduled with Dr. Yetta Barre @0900  -05/23/12. Protocol(s) Used: Cough - Adult Recommended Outcome per Protocol: See Provider within 24 hours Reason for Outcome: Gradual onset of cough when lying down AND having to sleep on more than one pillow or with head of bed elevated Care Advice: Limit or avoid exposure to irritants and allergens (e.g. air pollution, smoke/smoking, chemicals, dust, pollen, pet dander, etc.) ~ Avoid any activity that produces symptoms until evaluated by provider. Go to ED IMMEDIATELY if developing increased shortness of breath, continuous cough, worsening fatigue, or unable to perform ADLs. Provide for rest periods by spacing activities that require physical exertion. Promote calm, restful environment; anxiety increases breathing difficulty. ~ SYMPTOM / CONDITION MANAGEMENT ~ CAUTIONS ~ Call EMS 911 if having chest pain lasting more than 5 minutes, fainting, or rapid or irregular heart beat. ~ Call provider if develop sudden weight gain, swelling of feet and/or pain in the  abdomen, fatigue or trouble sleeping. Follow your treatment plan as given to you by your provider to manage symptoms, including reducing salt intake, limiting intake of fluids, monitoring daily weights, and exercising as instructed.

## 2012-08-02 ENCOUNTER — Ambulatory Visit (INDEPENDENT_AMBULATORY_CARE_PROVIDER_SITE_OTHER): Payer: 59 | Admitting: Internal Medicine

## 2012-08-02 ENCOUNTER — Encounter: Payer: Self-pay | Admitting: Internal Medicine

## 2012-08-02 VITALS — BP 110/78 | HR 110 | Temp 98.1°F | Resp 16

## 2012-08-02 DIAGNOSIS — F900 Attention-deficit hyperactivity disorder, predominantly inattentive type: Secondary | ICD-10-CM

## 2012-08-02 DIAGNOSIS — R000 Tachycardia, unspecified: Secondary | ICD-10-CM | POA: Insufficient documentation

## 2012-08-02 DIAGNOSIS — F988 Other specified behavioral and emotional disorders with onset usually occurring in childhood and adolescence: Secondary | ICD-10-CM

## 2012-08-02 MED ORDER — ATOMOXETINE HCL 25 MG PO CAPS: 25.0000 mg | ORAL_CAPSULE | Freq: Every day | ORAL | Status: DC

## 2012-08-02 NOTE — Patient Instructions (Signed)
Attention Deficit Hyperactivity Disorder Attention deficit hyperactivity disorder (ADHD) is a problem with behavior issues based on the way the brain functions (neurobehavioral disorder). It is a common reason for behavior and academic problems in school. CAUSES  The cause of ADHD is unknown in most cases. It may run in families. It sometimes can be associated with learning disabilities and other behavioral problems. SYMPTOMS  There are 3 types of ADHD. The 3 types and some of the symptoms include:  Inattentive  Gets bored or distracted easily.  Loses or forgets things. Forgets to hand in homework.  Has trouble organizing or completing tasks.  Difficulty staying on task.  An inability to organize daily tasks and school work.  Leaving projects, chores, or homework unfinished.  Trouble paying attention or responding to details. Careless mistakes.  Difficulty following directions. Often seems like is not listening.  Dislikes activities that require sustained attention (like chores or homework).  Hyperactive-impulsive  Feels like it is impossible to sit still or stay in a seat. Fidgeting with hands and feet.  Trouble waiting turn.  Talking too much or out of turn. Interruptive.  Speaks or acts impulsively.  Aggressive, disruptive behavior.  Constantly busy or on the go, noisy.  Combined  Has symptoms of both of the above. Often children with ADHD feel discouraged about themselves and with school. They often perform well below their abilities in school. These symptoms can cause problems in home, school, and in relationships with peers. As children get older, the excess motor activities can calm down, but the problems with paying attention and staying organized persist. Most children do not outgrow ADHD but with good treatment can learn to cope with the symptoms. DIAGNOSIS  When ADHD is suspected, the diagnosis should be made by professionals trained in ADHD.  Diagnosis will  include:  Ruling out other reasons for the child's behavior.  The caregivers will check with the child's school and check their medical records.  They will talk to teachers and parents.  Behavior rating scales for the child will be filled out by those dealing with the child on a daily basis. A diagnosis is made only after all information has been considered. TREATMENT  Treatment usually includes behavioral treatment often along with medicines. It may include stimulant medicines. The stimulant medicines decrease impulsivity and hyperactivity and increase attention. Other medicines used include antidepressants and certain blood pressure medicines. Most experts agree that treatment for ADHD should address all aspects of the child's functioning. Treatment should not be limited to the use of medicines alone. Treatment should include structured classroom management. The parents must receive education to address rewarding good behavior, discipline, and limit-setting. Tutoring or behavioral therapy or both should be available for the child. If untreated, the disorder can have long-term serious effects into adolescence and adulthood. HOME CARE INSTRUCTIONS   Often with ADHD there is a lot of frustration among the family in dealing with the illness. There is often blame and anger that is not warranted. This is a life long illness. There is no way to prevent ADHD. In many cases, because the problem affects the family as a whole, the entire family may need help. A therapist can help the family find better ways to handle the disruptive behaviors and promote change. If the child is young, most of the therapist's work is with the parents. Parents will learn techniques for coping with and improving their child's behavior. Sometimes only the child with the ADHD needs counseling. Your caregivers can help   you make these decisions.  Children with ADHD may need help in organizing. Some helpful tips include:  Keep  routines the same every day from wake-up time to bedtime. Schedule everything. This includes homework and playtime. This should include outdoor and indoor recreation. Keep the schedule on the refrigerator or a bulletin board where it is frequently seen. Mark schedule changes as far in advance as possible.  Have a place for everything and keep everything in its place. This includes clothing, backpacks, and school supplies.  Encourage writing down assignments and bringing home needed books.  Offer your child a well-balanced diet. Breakfast is especially important for school performance. Children should avoid drinks with caffeine including:  Soft drinks.  Coffee.  Tea.  However, some older children (adolescents) may find these drinks helpful in improving their attention.  Children with ADHD need consistent rules that they can understand and follow. If rules are followed, give small rewards. Children with ADHD often receive, and expect, criticism. Look for good behavior and praise it. Set realistic goals. Give clear instructions. Look for activities that can foster success and self-esteem. Make time for pleasant activities with your child. Give lots of affection.  Parents are their children's greatest advocates. Learn as much as possible about ADHD. This helps you become a stronger and better advocate for your child. It also helps you educate your child's teachers and instructors if they feel inadequate in these areas. Parent support groups are often helpful. A national group with local chapters is called CHADD (Children and Adults with Attention Deficit Hyperactivity Disorder). PROGNOSIS  There is no cure for ADHD. Children with the disorder seldom outgrow it. Many find adaptive ways to accommodate the ADHD as they mature. SEEK MEDICAL CARE IF:  Your child has repeated muscle twitches, cough or speech outbursts.  Your child has sleep problems.  Your child has a marked loss of  appetite.  Your child develops depression.  Your child has new or worsening behavioral problems.  Your child develops dizziness.  Your child has a racing heart.  Your child has stomach pains.  Your child develops headaches. Document Released: 04/10/2002 Document Revised: 07/13/2011 Document Reviewed: 11/21/2007 Exit[employer] Patient Information 2013 [employer].  

## 2012-08-02 NOTE — Assessment & Plan Note (Signed)
She has no s/s Her EKG is normal  Will lower the strattera dose and follow for now

## 2012-08-02 NOTE — Progress Notes (Signed)
Subjective:    Patient ID: Connie Freeman, female    DOB: 05/18/1965, 47 y.o.   MRN: [id number]  HPI  She returns for f/up and she tells me that she has been seeing Marisue Brooklyn, DO for treatment of ADHD and has been doing well on strattera but worries that the heart rate may be up so she wants to lower her dose. She has tried to reach Dr. Elisabeth Most for the lat 3 days to no avail so she wants me to write the Rx for strattera.  Review of Systems  Constitutional: Negative.  Negative for fever, chills, diaphoresis, activity change, appetite change, fatigue and unexpected weight change.  HENT: Negative.   Eyes: Negative.   Respiratory: Negative.  Negative for cough, choking, chest tightness, shortness of breath, wheezing and stridor.   Cardiovascular: Negative.  Negative for chest pain, palpitations and leg swelling.  Gastrointestinal: Negative for nausea, vomiting, abdominal pain, diarrhea, constipation and blood in stool.  Endocrine: Negative.   Genitourinary: Negative.   Musculoskeletal: Negative.  Negative for myalgias, back pain, joint swelling, arthralgias and gait problem.  Skin: Negative.  Negative for color change, pallor, rash and wound.  Allergic/Immunologic: Negative.   Neurological: Negative.  Negative for dizziness.  Hematological: Negative.  Negative for adenopathy. Does not bruise/bleed easily.  Psychiatric/Behavioral: Positive for decreased concentration. Negative for suicidal ideas, hallucinations, behavioral problems, confusion, sleep disturbance, self-injury, dysphoric mood and agitation. The patient is not nervous/anxious and is not hyperactive.        Objective:   Physical Exam  Vitals reviewed. Constitutional: She is oriented to person, place, and time. She appears well-developed and well-nourished. No distress.  HENT:  Head: Normocephalic and atraumatic.  Mouth/Throat: Oropharynx is clear and moist. No oropharyngeal exudate.  Eyes: Conjunctivae are normal.  Right eye exhibits no discharge. Left eye exhibits no discharge. No scleral icterus.  Neck: Normal range of motion. Neck supple. No JVD present. No tracheal deviation present. No thyromegaly present.  Cardiovascular: Normal rate, regular rhythm, normal heart sounds and intact distal pulses.  Exam reveals no gallop and no friction rub.   No murmur heard. Pulmonary/Chest: Effort normal and breath sounds normal. No stridor. No respiratory distress. She has no wheezes. She has no rales. She exhibits no tenderness.  Abdominal: Soft. Bowel sounds are normal. She exhibits no distension and no mass. There is no tenderness. There is no rebound and no guarding.  Musculoskeletal: Normal range of motion. She exhibits no edema and no tenderness.  Lymphadenopathy:    She has no cervical adenopathy.  Neurological: She is oriented to person, place, and time.  Skin: Skin is warm and dry. No rash noted. She is not diaphoretic. No erythema. No pallor.  Psychiatric: She has a normal mood and affect. Her behavior is normal. Judgment and thought content normal. Her mood appears not anxious. Her affect is not angry, not blunt, not labile and not inappropriate. Her speech is not rapid and/or pressured, not delayed, not tangential and not slurred. She is not agitated, not aggressive, not hyperactive, not slowed, not withdrawn, not actively hallucinating and not combative. Thought content is not paranoid and not delusional. Cognition and memory are normal. Cognition and memory are not impaired. She does not express impulsivity or inappropriate judgment. She does not exhibit a depressed mood. She expresses no homicidal and no suicidal ideation. She expresses no suicidal plans and no homicidal plans. She is communicative. She exhibits normal recent memory and normal remote memory. She is attentive.  Lab Results  Component Value Date   WBC 6.9 03/07/2012   HGB 14.2 03/07/2012   HCT 42.7 03/07/2012   PLT 304.0 03/07/2012    GLUCOSE 93 03/07/2012   CHOL 218* 03/07/2012   TRIG 125.0 03/07/2012   HDL 71.80 03/07/2012   LDLDIRECT 129.9 03/07/2012   LDLCALC 26 11/22/2009   ALT 18 03/07/2012   AST 22 03/07/2012   NA 136 03/07/2012   K 4.2 03/07/2012   CL 104 03/07/2012   CREATININE 0.6 03/07/2012   BUN 12 03/07/2012   CO2 26 03/07/2012   TSH 0.58 03/07/2012      Assessment & Plan:

## 2012-08-02 NOTE — Assessment & Plan Note (Signed)
She will try a lower dose of strattera

## 2012-08-30 ENCOUNTER — Telehealth: Payer: Self-pay | Admitting: Internal Medicine

## 2012-08-30 DIAGNOSIS — F900 Attention-deficit hyperactivity disorder, predominantly inattentive type: Secondary | ICD-10-CM

## 2012-08-30 MED ORDER — ATOMOXETINE HCL 25 MG PO CAPS: 25.0000 mg | ORAL_CAPSULE | Freq: Every day | ORAL | Status: DC

## 2012-08-30 NOTE — Telephone Encounter (Signed)
Pt req prescription to be send to [facility] for Strattera for 3 month supply. Please advise. [facility] fax # [phone].

## 2012-09-03 ENCOUNTER — Telehealth: Payer: Self-pay

## 2012-09-03 NOTE — Telephone Encounter (Signed)
Pt called and states she has been trying to get in contact with someone for a few days.  Pt states her Strattera was sent to [facility] and they are stating that it was not received.  Advised pt that rx was sent on 08/30/12.  Pt is requesting to have rx resent to pharmacy and also states she will soon be out of medication and she cannot go "cold [place]" off this medication.  Advised pt that this medication could nobe refilled over the weekend and a message would be sent to pt's PCP.  Per Dr. Tawanna Cooler pt needs to contact her PCP on 5/5 and request medication.  Pls advise.

## 2012-09-05 ENCOUNTER — Other Ambulatory Visit: Payer: Self-pay | Admitting: Internal Medicine

## 2012-09-05 DIAGNOSIS — F900 Attention-deficit hyperactivity disorder, predominantly inattentive type: Secondary | ICD-10-CM

## 2012-09-05 MED ORDER — ATOMOXETINE HCL 25 MG PO CAPS: 25.0000 mg | ORAL_CAPSULE | Freq: Every day | ORAL | Status: DC

## 2012-09-05 NOTE — Telephone Encounter (Signed)
Pt would like a written script to pick up that would hold her until mail order arrives

## 2012-09-09 ENCOUNTER — Telehealth: Payer: Self-pay | Admitting: Internal Medicine

## 2012-09-09 ENCOUNTER — Other Ambulatory Visit: Payer: Self-pay | Admitting: Internal Medicine

## 2012-09-09 DIAGNOSIS — F900 Attention-deficit hyperactivity disorder, predominantly inattentive type: Secondary | ICD-10-CM

## 2012-09-09 MED ORDER — ATOMOXETINE HCL 25 MG PO CAPS: 25.0000 mg | ORAL_CAPSULE | Freq: Every day | ORAL | Status: DC

## 2012-09-09 NOTE — Telephone Encounter (Addendum)
Spoke with patient [facility] has not received the [employer]. Dr. Yetta Barre placed the order and sent script electronically. Called [facility] and confirmed with the pharmacist that the script request had been received. Called patient with message left on provided # [phone] that the script was sent and received as she requested.

## 2012-09-09 NOTE — Telephone Encounter (Signed)
Pt called in stating that Strattera was never sent to [facility] pharmacy

## 2012-09-09 NOTE — Telephone Encounter (Signed)
See Phone note dated 09-09-12

## 2013-01-16 ENCOUNTER — Telehealth: Payer: Self-pay | Admitting: Internal Medicine

## 2013-01-16 NOTE — Telephone Encounter (Signed)
Sorry, no new patients

## 2013-01-16 NOTE — Telephone Encounter (Signed)
yes

## 2013-01-16 NOTE — Telephone Encounter (Signed)
Pt would like to switch to Dr Amador Cunas

## 2013-01-17 NOTE — Telephone Encounter (Signed)
lmom for pt to call back

## 2013-01-18 NOTE — Telephone Encounter (Signed)
Pt is aware.  

## 2013-05-16 ENCOUNTER — Emergency Department (HOSPITAL_COMMUNITY)
Admission: EM | Admit: 2013-05-16 | Discharge: 2013-05-16 | Disposition: A | Discharge status: Home / Self Care | Payer: 59 | Source: Home / Self Care | Attending: Family Medicine | Admitting: Family Medicine

## 2013-05-16 ENCOUNTER — Encounter (HOSPITAL_COMMUNITY): Payer: Self-pay | Admitting: Emergency Medicine

## 2013-05-16 DIAGNOSIS — J069 Acute upper respiratory infection, unspecified: Secondary | ICD-10-CM

## 2013-05-16 MED ORDER — AZITHROMYCIN 250 MG PO TABS: ORAL_TABLET | ORAL | Status: DC

## 2013-05-16 MED ORDER — IPRATROPIUM BROMIDE 0.06 % NA SOLN: 2.0000 | Freq: Four times a day (QID) | NASAL | Status: DC

## 2013-05-16 NOTE — ED Provider Notes (Signed)
CSN: [id number]     Arrival date & time 05/16/13  1005 History   First MD Initiated Contact with Patient 05/16/13 1042     Chief Complaint  Patient presents with  . URI   (Consider location/radiation/quality/duration/timing/severity/associated sxs/prior Treatment) Patient is a 48 y.o. female presenting with URI. The history is provided by the patient.  URI Presenting symptoms: congestion, cough, facial pain and rhinorrhea   Presenting symptoms: no fever and no sore throat   Severity:  Moderate Onset quality:  Gradual Duration:  1 week Progression:  Unchanged Chronicity:  New Ineffective treatments:  OTC medications Associated symptoms: sinus pain   Risk factors: sick contacts     Past Medical History  Diagnosis Date  . Depression   . Allergy   . Asthma    Past Surgical History  Procedure Laterality Date  . Tonsillectomy     Family History  Problem Relation Age of Onset  . Hypertension Other   . Hyperlipidemia Other    History  Substance Use Topics  . Smoking status: Never Smoker   . Smokeless tobacco: Never Used     Comment: Regular Exercise-Yes  . Alcohol Use: No   OB History   Grav Para Term Preterm Abortions TAB SAB Ect Mult Living                 Review of Systems  Constitutional: Negative.  Negative for fever.  HENT: Positive for congestion, rhinorrhea and sinus pressure. Negative for sore throat.   Respiratory: Positive for cough.     Allergies  Adderall  Home Medications   Current Outpatient Rx  Name  Route  Sig  Dispense  Refill  . atomoxetine (STRATTERA) 25 MG capsule   Oral   Take 1 capsule (25 mg total) by mouth daily.   90 capsule   3   . azithromycin (ZITHROMAX Z-PAK) 250 MG tablet      Take as directed on pack   6 each   0   . ipratropium (ATROVENT) 0.06 % nasal spray   Nasal   Place 2 sprays into the nose 4 (four) times daily.   15 mL   1    BP 123/89  Pulse 90  Temp(Src) 97.9 F (36.6 C) (Oral)  Resp 14  SpO2 98%   LMP 05/16/2013 Physical Exam  Nursing note and vitals reviewed. Constitutional: She is oriented to person, place, and time. She appears well-developed and well-nourished.  HENT:  Head: Normocephalic.  Right Ear: External ear normal.  Left Ear: External ear normal.  Mouth/Throat: Oropharynx is clear and moist.  Neck: Normal range of motion. Neck supple.  Cardiovascular: Normal rate, regular rhythm, normal heart sounds and intact distal pulses.   Pulmonary/Chest: Effort normal and breath sounds normal.  Lymphadenopathy:    She has no cervical adenopathy.  Neurological: She is alert and oriented to person, place, and time.  Skin: Skin is warm and dry.    ED Course  Procedures (including critical care time) Labs Review Labs Reviewed - No data to display Imaging Review No results found.  EKG Interpretation    Date/Time:    Ventricular Rate:    PR Interval:    QRS Duration:   QT Interval:    QTC Calculation:   R Axis:     Text Interpretation:              MDM      Linna HoffJames D Kindl, MD 05/16/13 1145

## 2013-05-16 NOTE — Discharge Instructions (Signed)
Drink plenty of fluids as discussed, use medicine as prescribed, and mucinex or delsym for cough. Return or see your doctor if further problems °

## 2013-05-16 NOTE — ED Notes (Signed)
Pt c/o cold sxs onset 1 week w/sxs that include: HA, facial pressure, cough, runny nose, congestion Denies: f/v/n/d, SOB, wheezing... Taking mucinex w/no relief Alert w/no signs of acute distress.

## 2014-03-12 ENCOUNTER — Ambulatory Visit: Payer: Self-pay | Admitting: Family Medicine

## 2014-03-23 ENCOUNTER — Emergency Department (HOSPITAL_BASED_OUTPATIENT_CLINIC_OR_DEPARTMENT_OTHER): Payer: 59

## 2014-03-23 ENCOUNTER — Encounter (HOSPITAL_COMMUNITY): Payer: Self-pay | Admitting: *Deleted

## 2014-03-23 ENCOUNTER — Emergency Department (HOSPITAL_BASED_OUTPATIENT_CLINIC_OR_DEPARTMENT_OTHER)
Admission: EM | Admit: 2014-03-23 | Discharge: 2014-03-23 | Disposition: A | Discharge status: Home / Self Care | Payer: 59 | Attending: Emergency Medicine | Admitting: Emergency Medicine

## 2014-03-23 ENCOUNTER — Encounter (HOSPITAL_BASED_OUTPATIENT_CLINIC_OR_DEPARTMENT_OTHER): Payer: Self-pay | Admitting: *Deleted

## 2014-03-23 ENCOUNTER — Emergency Department (HOSPITAL_COMMUNITY)
Admission: EM | Admit: 2014-03-23 | Discharge: 2014-03-23 | Disposition: A | Discharge status: Home / Self Care | Payer: 59 | Source: Home / Self Care | Attending: Emergency Medicine | Admitting: Emergency Medicine

## 2014-03-23 DIAGNOSIS — X58XXXA Exposure to other specified factors, initial encounter: Secondary | ICD-10-CM | POA: Diagnosis not present

## 2014-03-23 DIAGNOSIS — Y939 Activity, unspecified: Secondary | ICD-10-CM | POA: Diagnosis not present

## 2014-03-23 DIAGNOSIS — Z792 Long term (current) use of antibiotics: Secondary | ICD-10-CM | POA: Insufficient documentation

## 2014-03-23 DIAGNOSIS — J45909 Unspecified asthma, uncomplicated: Secondary | ICD-10-CM | POA: Insufficient documentation

## 2014-03-23 DIAGNOSIS — F329 Major depressive disorder, single episode, unspecified: Secondary | ICD-10-CM | POA: Diagnosis not present

## 2014-03-23 DIAGNOSIS — Y929 Unspecified place or not applicable: Secondary | ICD-10-CM | POA: Insufficient documentation

## 2014-03-23 DIAGNOSIS — R091 Pleurisy: Secondary | ICD-10-CM | POA: Diagnosis not present

## 2014-03-23 DIAGNOSIS — Y998 Other external cause status: Secondary | ICD-10-CM | POA: Insufficient documentation

## 2014-03-23 DIAGNOSIS — Z79899 Other long term (current) drug therapy: Secondary | ICD-10-CM | POA: Diagnosis not present

## 2014-03-23 DIAGNOSIS — R0781 Pleurodynia: Secondary | ICD-10-CM

## 2014-03-23 DIAGNOSIS — S2242XA Multiple fractures of ribs, left side, initial encounter for closed fracture: Secondary | ICD-10-CM | POA: Diagnosis not present

## 2014-03-23 LAB — COMPREHENSIVE METABOLIC PANEL
ALT: 39 U/L — ABNORMAL HIGH (ref 0–35)
ANION GAP: 11 (ref 5–15)
AST: 38 U/L — ABNORMAL HIGH (ref 0–37)
Albumin: 3.6 g/dL (ref 3.5–5.2)
Alkaline Phosphatase: 73 U/L (ref 39–117)
BUN: 10 mg/dL (ref 6–23)
CALCIUM: 9.4 mg/dL (ref 8.4–10.5)
CO2: 29 mEq/L (ref 19–32)
CREATININE: 0.6 mg/dL (ref 0.50–1.10)
Chloride: 96 mEq/L (ref 96–112)
GFR calc non Af Amer: >90 mL/min (ref >90)
Glucose, Bld: 105 mg/dL — ABNORMAL HIGH (ref 70–99)
Potassium: 4.7 mEq/L (ref 3.7–5.3)
Sodium: 136 mEq/L — ABNORMAL LOW (ref 137–147)
TOTAL PROTEIN: 7.4 g/dL (ref 6.0–8.3)
Total Bilirubin: 0.4 mg/dL (ref 0.3–1.2)

## 2014-03-23 LAB — D-DIMER, QUANTITATIVE (NOT AT ARMC): D DIMER QUANT: 0.52 ug{FEU}/mL — AB (ref 0.00–0.48)

## 2014-03-23 LAB — CBC WITH DIFFERENTIAL/PLATELET
Basophils Absolute: 0 10*3/uL (ref 0.0–0.1)
Basophils Relative: 0 % (ref 0–1)
EOS ABS: 0.4 10*3/uL (ref 0.0–0.7)
EOS PCT: 2 % (ref 0–5)
HEMATOCRIT: 43.8 % (ref 36.0–46.0)
HEMOGLOBIN: 14.4 g/dL (ref 12.0–15.0)
LYMPHS ABS: 2.4 10*3/uL (ref 0.7–4.0)
LYMPHS PCT: 14 % (ref 12–46)
MCH: 31.8 pg (ref 26.0–34.0)
MCHC: 32.9 g/dL (ref 30.0–36.0)
MCV: 96.7 fL (ref 78.0–100.0)
MONO ABS: 1.3 10*3/uL — AB (ref 0.1–1.0)
MONOS PCT: 7 % (ref 3–12)
Neutro Abs: 13.3 10*3/uL — ABNORMAL HIGH (ref 1.7–7.7)
Neutrophils Relative %: 77 % (ref 43–77)
PLATELETS: 326 10*3/uL (ref 150–400)
RBC: 4.53 MIL/uL (ref 3.87–5.11)
RDW: 13.6 % (ref 11.5–15.5)
WBC: 17.4 10*3/uL — ABNORMAL HIGH (ref 4.0–10.5)

## 2014-03-23 LAB — TROPONIN I: Troponin I: <0.3 ng/mL (ref <0.30)

## 2014-03-23 MED ORDER — IBUPROFEN 800 MG PO TABS: 800.0000 mg | ORAL_TABLET | Freq: Three times a day (TID) | ORAL | Status: DC | PRN

## 2014-03-23 MED ORDER — TRAMADOL HCL 50 MG PO TABS
50.0000 mg | ORAL_TABLET | Freq: Four times a day (QID) | ORAL | Status: DC | PRN
Start: 2014-03-23 — End: 2015-06-22

## 2014-03-23 MED ORDER — IBUPROFEN 800 MG PO TABS: 800.0000 mg | ORAL_TABLET | Freq: Three times a day (TID) | ORAL | Status: DC

## 2014-03-23 MED ORDER — IOHEXOL 350 MG/ML SOLN
100.0000 mL | Freq: Once | INTRAVENOUS | Status: AC | PRN
  Administered 2014-03-23: 100 mL via INTRAVENOUS

## 2014-03-23 MED ORDER — IBUPROFEN 400 MG PO TABS
400.0000 mg | ORAL_TABLET | Freq: Once | ORAL | Status: AC
  Administered 2014-03-23: 400 mg via ORAL
  Filled 2014-03-23: qty 1

## 2014-03-23 MED ORDER — CYCLOBENZAPRINE HCL 10 MG PO TABS: 10.0000 mg | ORAL_TABLET | Freq: Three times a day (TID) | ORAL | Status: DC | PRN

## 2014-03-23 MED ORDER — KETOROLAC TROMETHAMINE 60 MG/2ML IM SOLN
INTRAMUSCULAR | Status: AC
  Filled 2014-03-23: qty 2

## 2014-03-23 MED ORDER — KETOROLAC TROMETHAMINE 60 MG/2ML IM SOLN
60.0000 mg | Freq: Once | INTRAMUSCULAR | Status: AC
  Administered 2014-03-23: 60 mg via INTRAMUSCULAR

## 2014-03-23 NOTE — ED Notes (Addendum)
Patient states she developed a cough one week ago. Was seen by her PCP and diagnosed with pleurisy and started on a steroid. States the pain in her lower left chest became worse.  Was seen today at [facility] and sent here for a CT scan to r/o a blood clot. Was given a toradol injection at Urgent Care prior to coming to ED today.

## 2014-03-23 NOTE — ED Provider Notes (Signed)
CSN: [id number]     Arrival date & time 03/23/14  [id number] History   First MD Initiated Contact with Patient 03/23/14 1215     Chief Complaint  Patient presents with  . Pleurisy     (Consider location/radiation/quality/duration/timing/severity/associated sxs/prior Treatment) HPI   48 year old female with history of asthma, depression, anxiety presents for evaluation of pleuritic chest pain. Patient developed a cough for the past week. Cough initially started when patient was exposed to a coworker at work with a viral infection. Cough primarily to her left chest worsening with breathing. Described pain as intense and sharp and sometimes worsen with movement. She was initially seen by her PCP 5 days ago, chest x-ray at that time showed some scarring but no evidence of pneumonia. She was treated for pleurisy with prednisone and Percocet. She had initially, that was negative. Her symptoms did not improve and therefore she was seen at urgent care today for the same complaint. From reviewing the previous charts, patient denies any recent immobilization, leg pain or swelling, all family history of blood clots. She has no other significant risk factors for PE. No complaints of fever, chills, increased shortness of breath, dyspnea on exertion, hemoptysis, unilateral leg swelling or calf pain. Patient does not take birth control.  Past Medical History  Diagnosis Date  . Depression   . Allergy   . Asthma     Seasonal airway reaction.   Past Surgical History  Procedure Laterality Date  . Tonsillectomy    . Tonsillectomy    . Inguinal hernia repair     Family History  Problem Relation Age of Onset  . Hypertension Other   . Hyperlipidemia Other    History  Substance Use Topics  . Smoking status: Never Smoker   . Smokeless tobacco: Never Used     Comment: Regular Exercise-Yes  . Alcohol Use: No   OB History    No data available     Review of Systems  All other systems reviewed and are  negative.     Allergies  Adderall  Home Medications   Prior to Admission medications   Medication Sig Start Date End Date Taking? Authorizing Provider  cyclobenzaprine (FLEXERIL) 10 MG tablet Take 1 tablet (10 mg total) by mouth 3 (three) times daily as needed for muscle spasms. 03/23/14  Yes Charm RingsErin J Honig, MD  atomoxetine (STRATTERA) 25 MG capsule Take 1 capsule (25 mg total) by mouth daily. 09/09/12   Etta Grandchildhomas L Jones, MD  azithromycin (ZITHROMAX Z-PAK) 250 MG tablet Take as directed on pack 05/16/13   Linna HoffJames D Kindl, MD  ibuprofen (ADVIL,MOTRIN) 800 MG tablet Take 1 tablet (800 mg total) by mouth 3 (three) times daily. 03/23/14   Charm RingsErin J Honig, MD  ipratropium (ATROVENT) 0.06 % nasal spray Place 2 sprays into the nose 4 (four) times daily. 05/16/13   Linna HoffJames D Kindl, MD   BP 116/71 mmHg  Pulse 86  Temp(Src) 98.1 F (36.7 C) (Oral)  Resp 20  Ht 5\' 4"  (1.626 m)  Wt 130 lb (58.968 kg)  BMI 22.30 kg/m2  SpO2 98%  LMP 03/18/2014 Physical Exam  Constitutional: She is oriented to person, place, and time. She appears well-developed and well-nourished. No distress.  HENT:  Head: Atraumatic.  Eyes: Conjunctivae are normal.  Neck: Neck supple.  Cardiovascular: Normal rate and regular rhythm.   Pulmonary/Chest: Effort normal and breath sounds normal. She exhibits tenderness (Tenderness along left anterior lateral aspects of chest and along the ribs  without any  overlying skin changes bruising, emphysema, or crepitus).  Musculoskeletal: She exhibits no edema.  Neurological: She is alert and oriented to person, place, and time.  Skin: No rash noted.  Psychiatric: She has a normal mood and affect.  Nursing note and vitals reviewed.   ED Course  Procedures (including critical care time)  12:47 PM Patient presents complaining of pleuritic chest pain and cough ongoing for the past week. She does not have any significant risk factor for PE and her pain is less likely to be ACS. Her blood work is  remarkable for an elevated WBC of 17.4 however she is currently on prednisone which may likely be the cause. Her d-dimer is mildly elevated at 0.52, therefore chest CTA ordered to rule out PE. Patient otherwise currently in no acute distress. She does have reproducible left anterior chest wall tenderness on palpation, but no evidence of rash to suggest shingles.  2:09 PM Chest CTA shows no evidence of PE or pneumonia. However there is evidence of ribs fracture involving ribs 5, 6 and 7 on the left chest. This is a close ribs injury, no lung involvement. No evidence of flail chest. Result was discussed with patient. Recommend using incentive spirometer to decrease risk of pneumonia. Definitive ribs treatment discussed. Will prescribe NSAIDs, and give return precaution. Patient voiced understanding and agrees with the pain.  Labs Review Labs Reviewed  CBC WITH DIFFERENTIAL - Abnormal; Notable for the following:    WBC 17.4 (*)    Neutro Abs 13.3 (*)    Monocytes Absolute 1.3 (*)    All other components within normal limits  COMPREHENSIVE METABOLIC PANEL - Abnormal; Notable for the following:    Sodium 136 (*)    Glucose, Bld 105 (*)    AST 38 (*)    ALT 39 (*)    All other components within normal limits  D-DIMER, QUANTITATIVE - Abnormal; Notable for the following:    D-Dimer, Quant 0.52 (*)    All other components within normal limits  TROPONIN I    Imaging Review Ct Angio Chest Pe W/cm &/or Wo Cm  03/23/2014   CLINICAL DATA:  48 year old female with a history of chest pain.  EXAM: CT ANGIOGRAPHY CHEST WITH CONTRAST  TECHNIQUE: Multidetector CT imaging of the chest was performed using the standard protocol during bolus administration of intravenous contrast. Multiplanar CT image reconstructions and MIPs were obtained to evaluate the vascular anatomy.  CONTRAST:  OMNIPAQUE IOHEXOL 350 MG/ML SOLN  COMPARISON:  Prior plain film 03/12/2014  FINDINGS: Unremarkable appearance of the chest  superficial soft tissues.  No axillary or supraclavicular adenopathy.  Unremarkable appearance of the thoracic inlet, including the visualized thyroid.  Several mediastinal lymph nodes, none of which are enlarged by CT size criteria or have suspicious features.  Unremarkable appearance of the esophagus.  Unremarkable course caliber and contour of the thoracic aorta without dissection flap, aneurysm, periaortic fluid.  No central, lobar, segmental, or proximal subsegmental filling defects to indicate pulmonary emboli.  Heart size within normal limits without pericardial fluid/ thickening.  No confluent airspace disease. Linear opacities at the bilateral lung bases may reflect atelectasis/scarring.  No pneumothorax or pleural effusion.  Upper abdomen:  Small hiatal hernia, with otherwise unremarkable appearance of the upper abdomen.  Sequential rib fractures on the left, at the level of the left breast including ribs 5, 6, 7, without significant callus formation.  Review of the MIP images confirms the above findings.  IMPRESSION: Study is negative for pulmonary  emboli.  Rib fractures of the left anterior ribs 5, 6, and 7 without significant healing. This is at the level of the left breast.  Signed,  Yvone NeuJaime S. Loreta AveWagner, DO  [facility]  [facility]   Electronically Signed   By: Gilmer MorJaime  Wagner D.O.   On: 03/23/2014 13:33     EKG Interpretation None      MDM   Final diagnoses:  Pleurisy  Fracture of ribs, multiple, closed, left, initial encounter    BP 128/76 mmHg  Pulse 94  Temp(Src) 98.1 F (36.7 C) (Oral)  Resp 18  Ht 5\' 4"  (1.626 m)  Wt 130 lb (58.968 kg)  BMI 22.30 kg/m2  SpO2 98%  LMP 03/18/2014  I have reviewed nursing notes and vital signs. I personally reviewed the imaging tests through PACS system  I reviewed available ER/hospitalization records thought the EMR     Fayrene HelperBowie Mcihael Hinderman, PA-C 03/23/14 1410  Rolan BuccoMelanie Belfi, MD 03/23/14 1528

## 2014-03-23 NOTE — Discharge Instructions (Signed)
Your chest pain is related to ribs fracture (rib 5,6,7) likely from persistent coughing.  Please follow instruction below for care.  Take ibuprofen as needed for pain.  Use incentive spirometer as instructed to decrease risk of developing lung infection.  Follow up with your doctor for further care.  Return if you have any concerns.   Rib Fracture A rib fracture is a break or crack in one of the bones of the ribs. The ribs are a group of long, curved bones that wrap around your chest and attach to your spine. They protect your lungs and other organs in the chest cavity. A broken or cracked rib is often painful, but most do not cause other problems. Most rib fractures heal on their own over time. However, rib fractures can be more serious if multiple ribs are broken or if broken ribs move out of place and push against other structures. CAUSES   A direct blow to the chest. For example, this could happen during contact sports, a car accident, or a fall against a hard object.  Repetitive movements with high force, such as pitching a baseball or having severe coughing spells. SYMPTOMS   Pain when you breathe in or cough.  Pain when someone presses on the injured area. DIAGNOSIS  Your caregiver will perform a physical exam. Various imaging tests may be ordered to confirm the diagnosis and to look for related injuries. These tests may include a chest X-ray, computed tomography (CT), magnetic resonance imaging (MRI), or a bone scan. TREATMENT  Rib fractures usually heal on their own in 1-3 months. The longer healing period is often associated with a continued cough or other aggravating activities. During the healing period, pain control is very important. Medication is usually given to control pain. Hospitalization or surgery may be needed for more severe injuries, such as those in which multiple ribs are broken or the ribs have moved out of place.  HOME CARE INSTRUCTIONS   Avoid strenuous activity and  any activities or movements that cause pain. Be careful during activities and avoid bumping the injured rib.  Gradually increase activity as directed by your caregiver.  Only take over-the-counter or prescription medications as directed by your caregiver. Do not take other medications without asking your caregiver first.  Apply ice to the injured area for the first 1-2 days after you have been treated or as directed by your caregiver. Applying ice helps to reduce inflammation and pain.  Put ice in a plastic bag.  Place a towel between your skin and the bag.   Leave the ice on for 15-20 minutes at a time, every 2 hours while you are awake.  Perform deep breathing as directed by your caregiver. This will help prevent pneumonia, which is a common complication of a broken rib. Your caregiver may instruct you to:  Take deep breaths several times a day.  Try to cough several times a day, holding a pillow against the injured area.  Use a device called an incentive spirometer to practice deep breathing several times a day.  Drink enough fluids to keep your urine clear or pale yellow. This will help you avoid constipation.   Do not wear a rib belt or binder. These restrict breathing, which can lead to pneumonia.  SEEK IMMEDIATE MEDICAL CARE IF:   You have a fever.   You have difficulty breathing or shortness of breath.   You develop a continual cough, or you cough up thick or bloody sputum.  You  feel sick to your stomach (nausea), throw up (vomit), or have abdominal pain.   You have worsening pain not controlled with medications.  MAKE SURE YOU:  Understand these instructions.  Will watch your condition.  Will get help right away if you are not doing well or get worse. Document Released: 04/20/2005 Document Revised: 12/21/2012 Document Reviewed: 06/22/2012 [facility] Patient Information 2015 [place] [place]. This information is not intended to replace advice given to you by  your health care provider. Make sure you discuss any questions you have with your health care provider.

## 2014-03-23 NOTE — Discharge Instructions (Signed)
Take ibuprofen 800mg  3 times a day. Take flexeril 10mg  3 times a day.  This medicine will make you loopy and tired.  Do not drive.  If you pain is not improved in 48 hours or it changes/worsens or you get short of breath, go to the emergency room for additional evaluation.

## 2014-03-23 NOTE — ED Provider Notes (Addendum)
CSN: [id number]     Arrival date & time 03/23/14  0801 History   First MD Initiated Contact with Patient 03/23/14 0809     Chief Complaint  Patient presents with  . Pleurisy   (Consider location/radiation/quality/duration/timing/severity/associated sxs/prior Treatment) HPI  She is a 48 year old woman here for evaluation of chest pain. This is going on for about one week. It started in the context of a viral upper respiratory infection. It is located under the left breast. Described as very intense and sharp. Initially, it was worse with every deep breath, now it is only intermittently worse with deep breaths. It is also worse with going from lying to sitting. She was seen by her doctor 5 days ago and a chest x-ray showed some scarring and she was treated for pleurisy with prednisone and Percocet. They also did a d-dimer which was negative.  She reports no improvement with the pain.  She describes some intermittent shortness of breath. She denies any recent immobilization, leg pain or swelling. She denies any family history of blood clots.  Past Medical History  Diagnosis Date  . Depression   . Allergy   . Asthma    Past Surgical History  Procedure Laterality Date  . Tonsillectomy     Family History  Problem Relation Age of Onset  . Hypertension Other   . Hyperlipidemia Other    History  Substance Use Topics  . Smoking status: Never Smoker   . Smokeless tobacco: Never Used     Comment: Regular Exercise-Yes  . Alcohol Use: No   OB History    No data available     Review of Systems  Constitutional: Negative for fever and chills.  HENT: Positive for voice change.   Respiratory: Positive for shortness of breath.   Cardiovascular: Positive for chest pain. Negative for leg swelling.    Allergies  Adderall  Home Medications   Prior to Admission medications   Medication Sig Start Date End Date Taking? Authorizing Provider  atomoxetine (STRATTERA) 25 MG capsule Take 1  capsule (25 mg total) by mouth daily. 09/09/12   Etta Grandchildhomas L Jones, MD  azithromycin (ZITHROMAX Z-PAK) 250 MG tablet Take as directed on pack 05/16/13   Linna HoffJames D Kindl, MD  cyclobenzaprine (FLEXERIL) 10 MG tablet Take 1 tablet (10 mg total) by mouth 3 (three) times daily as needed for muscle spasms. 03/23/14   Charm RingsErin J Ashtan Girtman, MD  ibuprofen (ADVIL,MOTRIN) 800 MG tablet Take 1 tablet (800 mg total) by mouth 3 (three) times daily. 03/23/14   Charm RingsErin J Charniece Venturino, MD  ipratropium (ATROVENT) 0.06 % nasal spray Place 2 sprays into the nose 4 (four) times daily. 05/16/13   Linna HoffJames D Kindl, MD   BP 140/87 mmHg  Pulse 95  Temp(Src) 97.9 F (36.6 C) (Oral)  Resp 18  SpO2 98%  LMP 03/18/2014 Physical Exam  Constitutional: She is oriented to person, place, and time. She appears well-developed and well-nourished. She appears distressed (tearful).  Neck: Neck supple.  Cardiovascular: Normal rate, regular rhythm and normal heart sounds.   No murmur heard. Pulmonary/Chest: Effort normal and breath sounds normal. No respiratory distress. She has no wheezes. She has no rales. She exhibits tenderness (mild under the left breast).  Musculoskeletal: She exhibits no edema or tenderness.  Neurological: She is alert and oriented to person, place, and time.    ED Course  EKG  Date/Time: 03/23/2014 9:08 AM Performed by: Charm RingsHONIG, Eashan Schipani J Authorized by: Charm RingsHONIG, Rylen Swindler J Interpreted by ED physician Comparison:  compared with previous ECG  Similar to previous ECG Rhythm: sinus rhythm Rate: normal QRS axis: normal Conduction: conduction normal ST Segments: ST segments normal T Waves: T waves normal Other: no other findings Clinical impression: normal ECG Comments: Normal ekg   (including critical care time) Labs Review Labs Reviewed - No data to display  Imaging Review No results found.   MDM   1. Pleuritic chest pain   Toradol 60mg  IM given. Pain improved at rest, but still present with movement.  We had an extensive  discussion regarding possible causes of her pain and what additional workup may be warranted, including CTA of the chest. She is reluctant to go to the emergency room. Her EKG is normal. By report, she has had a negative d-dimer. She has also had an x-ray that showed some mild scarring, but was otherwise negative. She does have some mild chest wall tenderness, and the pain is most severe with movement, making musculoskeletal etiology likely.  PE is a possibility, but less likely given no signs of DVT, negative PERC, and a negative d-dimer. Will treat with ibuprofen and Flexeril. If her pain does not improve in 48 hours, a changes or worsens in anyway, she developed shortness of breath she will go to the emergency room for additional evaluation.    Charm RingsErin J Eryck Negron, MD 03/23/14 [id number]  Charm RingsErin J Naviyah Schaffert, MD 03/23/14 [phone]

## 2014-03-23 NOTE — ED Notes (Signed)
Pt  Reports   Symptoms   l  sided chest  Pain       X  sev  Weeks - she  Reports   Was   Seen  By  Her  PCP    For       Poss  Possible pleurisy       Pt  stes  Hurts  When  She  Takes  A  Deep breath    - she  Also reports  Bruising  To  l  Leg     She  Is anxious        As  Well somewhat

## 2014-07-16 ENCOUNTER — Other Ambulatory Visit: Payer: Self-pay | Admitting: Internal Medicine

## 2014-07-16 ENCOUNTER — Other Ambulatory Visit (HOSPITAL_COMMUNITY)
Admission: RE | Admit: 2014-07-16 | Discharge: 2014-07-16 | Disposition: A | Discharge status: Home / Self Care | Payer: 59 | Source: Ambulatory Visit | Attending: Internal Medicine | Admitting: Internal Medicine

## 2014-07-16 ENCOUNTER — Other Ambulatory Visit: Payer: Self-pay | Admitting: Registered Nurse

## 2014-07-16 DIAGNOSIS — N6315 Unspecified lump in the right breast, overlapping quadrants: Secondary | ICD-10-CM

## 2014-07-16 DIAGNOSIS — Z01419 Encounter for gynecological examination (general) (routine) without abnormal findings: Secondary | ICD-10-CM | POA: Diagnosis present

## 2014-07-16 DIAGNOSIS — N6310 Unspecified lump in the right breast, unspecified quadrant: Principal | ICD-10-CM

## 2014-07-18 ENCOUNTER — Ambulatory Visit
Admission: RE | Admit: 2014-07-18 | Discharge: 2014-07-18 | Disposition: A | Discharge status: Home / Self Care | Payer: 59 | Source: Ambulatory Visit | Attending: Internal Medicine | Admitting: Internal Medicine

## 2014-07-18 ENCOUNTER — Other Ambulatory Visit: Payer: Self-pay

## 2014-07-18 ENCOUNTER — Encounter (INDEPENDENT_AMBULATORY_CARE_PROVIDER_SITE_OTHER): Payer: Self-pay

## 2014-07-18 DIAGNOSIS — N6310 Unspecified lump in the right breast, unspecified quadrant: Principal | ICD-10-CM

## 2014-07-18 DIAGNOSIS — N6315 Unspecified lump in the right breast, overlapping quadrants: Secondary | ICD-10-CM

## 2014-07-18 LAB — CYTOLOGY - PAP

## 2014-12-31 ENCOUNTER — Encounter: Payer: Self-pay | Admitting: Family Medicine

## 2015-01-03 ENCOUNTER — Telehealth: Payer: Self-pay | Admitting: Family Medicine

## 2015-01-03 NOTE — Telephone Encounter (Signed)
I tried to call work, she just left

## 2015-01-03 NOTE — Telephone Encounter (Signed)
I called home number, left msg; brief, monitor, reasons to call after hours or weekend reviewed

## 2015-01-03 NOTE — Telephone Encounter (Signed)
Left message to call.

## 2015-01-03 NOTE — Telephone Encounter (Signed)
Pt called would like a call back. Pt cornered a cat and was scratched by the cat, vet wanted pt to call us about rabies concern. Cat is nursing and they have not located the kittens. Please call pt ASAP. Thanks

## 2015-01-03 NOTE — Telephone Encounter (Signed)
Patient states that she was scratched twice by a stray cat. They do not know if it was utd on its shots or if it has rabies or not. The vet has it in quarantine. She wants to know if she needs any type of treatment. She says it does not look infected. The vet told her to call us.

## 2015-01-03 NOTE — Telephone Encounter (Signed)
I prematurely closed the note.

## 2015-06-22 ENCOUNTER — Encounter (HOSPITAL_COMMUNITY): Payer: Self-pay | Admitting: Nurse Practitioner

## 2015-06-22 ENCOUNTER — Emergency Department (HOSPITAL_COMMUNITY)
Admission: EM | Admit: 2015-06-22 | Discharge: 2015-06-23 | Disposition: A | Discharge status: Home / Self Care | Payer: 59 | Attending: Emergency Medicine | Admitting: Emergency Medicine

## 2015-06-22 ENCOUNTER — Emergency Department (HOSPITAL_COMMUNITY): Payer: 59

## 2015-06-22 DIAGNOSIS — R1013 Epigastric pain: Secondary | ICD-10-CM | POA: Diagnosis present

## 2015-06-22 DIAGNOSIS — K8020 Calculus of gallbladder without cholecystitis without obstruction: Secondary | ICD-10-CM | POA: Diagnosis not present

## 2015-06-22 DIAGNOSIS — Z79899 Other long term (current) drug therapy: Secondary | ICD-10-CM | POA: Diagnosis not present

## 2015-06-22 DIAGNOSIS — J45909 Unspecified asthma, uncomplicated: Secondary | ICD-10-CM | POA: Insufficient documentation

## 2015-06-22 DIAGNOSIS — K8050 Calculus of bile duct without cholangitis or cholecystitis without obstruction: Secondary | ICD-10-CM

## 2015-06-22 LAB — COMPREHENSIVE METABOLIC PANEL
ALBUMIN: 3.7 g/dL (ref 3.5–5.0)
ALK PHOS: 52 U/L (ref 38–126)
ALT: 12 U/L — ABNORMAL LOW (ref 14–54)
AST: 21 U/L (ref 15–41)
Anion gap: 9 (ref 5–15)
BILIRUBIN TOTAL: 0.4 mg/dL (ref 0.3–1.2)
BUN: 8 mg/dL (ref 6–20)
CALCIUM: 9 mg/dL (ref 8.9–10.3)
CO2: 24 mmol/L (ref 22–32)
Chloride: 102 mmol/L (ref 101–111)
Creatinine, Ser: 0.74 mg/dL (ref 0.44–1.00)
GFR calc Af Amer: >60 mL/min (ref >60)
GFR calc non Af Amer: >60 mL/min (ref >60)
GLUCOSE: 95 mg/dL (ref 65–99)
Potassium: 4.5 mmol/L (ref 3.5–5.1)
SODIUM: 135 mmol/L (ref 135–145)
TOTAL PROTEIN: 6.7 g/dL (ref 6.5–8.1)

## 2015-06-22 LAB — CBC
HEMATOCRIT: 39.1 % (ref 36.0–46.0)
Hemoglobin: 12.4 g/dL (ref 12.0–15.0)
MCH: 29.8 pg (ref 26.0–34.0)
MCHC: 31.7 g/dL (ref 30.0–36.0)
MCV: 94 fL (ref 78.0–100.0)
Platelets: 357 10*3/uL (ref 150–400)
RBC: 4.16 MIL/uL (ref 3.87–5.11)
RDW: 13.2 % (ref 11.5–15.5)
WBC: 6.8 10*3/uL (ref 4.0–10.5)

## 2015-06-22 LAB — I-STAT BETA HCG BLOOD, ED (MC, WL, AP ONLY): I-stat hCG, quantitative: <5 m[IU]/mL (ref <5)

## 2015-06-22 LAB — LIPASE, BLOOD: Lipase: 31 U/L (ref 11–51)

## 2015-06-22 MED ORDER — SODIUM CHLORIDE 0.9 % IV SOLN
1000.0000 mL | INTRAVENOUS | Status: DC
  Administered 2015-06-22: 1000 mL via INTRAVENOUS

## 2015-06-22 MED ORDER — KETOROLAC TROMETHAMINE 30 MG/ML IJ SOLN: 30.0000 mg | Freq: Once | INTRAMUSCULAR | Status: DC

## 2015-06-22 MED ORDER — MORPHINE SULFATE (PF) 4 MG/ML IV SOLN
6.0000 mg | Freq: Once | INTRAVENOUS | Status: AC
  Administered 2015-06-22: 6 mg via INTRAVENOUS
  Filled 2015-06-22: qty 2

## 2015-06-22 MED ORDER — OXYCODONE-ACETAMINOPHEN 5-325 MG PO TABS
1.0000 | ORAL_TABLET | ORAL | Status: DC | PRN
Start: 2015-06-22 — End: 2015-06-26

## 2015-06-22 MED ORDER — SODIUM CHLORIDE 0.9 % IV SOLN
1000.0000 mL | Freq: Once | INTRAVENOUS | Status: AC
  Administered 2015-06-22: 1000 mL via INTRAVENOUS

## 2015-06-22 MED ORDER — ONDANSETRON 8 MG PO TBDP
8.0000 mg | ORAL_TABLET | Freq: Three times a day (TID) | ORAL | Status: DC | PRN
Start: 2015-06-22 — End: 2016-02-20

## 2015-06-22 MED ORDER — ONDANSETRON HCL 4 MG/2ML IJ SOLN
4.0000 mg | Freq: Once | INTRAMUSCULAR | Status: AC
  Administered 2015-06-22: 4 mg via INTRAVENOUS
  Filled 2015-06-22: qty 2

## 2015-06-22 NOTE — Discharge Instructions (Signed)

## 2015-06-22 NOTE — ED Notes (Signed)
She c/o 3 day history of generalized abd pain radiating into her back, noticed it after taking several doses NSAIDs for headache. Pt reports nausea, denies vomiting. She had 1 dark black hard stool 2 dasy ago, no BM since. She denies urinary changes. shes had some sweats too. She is A&Ox4, resp e/u

## 2015-06-22 NOTE — ED Notes (Signed)
The pt returned from [place].  Her pain is much better

## 2015-06-22 NOTE — ED Provider Notes (Signed)
CSN: [id number]     Arrival date & time 06/22/15  1214 History   First MD Initiated Contact with Patient 06/22/15 1739     Chief Complaint  Patient presents with  . Abdominal Pain      HPI Patient reports waxing and waning epigastric and upper abdominal pain over the past 3 days with some radiation to her back.  She reports nausea without vomiting.  She denies diarrhea.  No fevers or chills.  No history of excessive NSAID use.  Denies daily alcohol.  Pain is moderate in severity.  No fevers or chills.   Past Medical History  Diagnosis Date  . Allergy   . Asthma     Seasonal airway reaction.   Past Surgical History  Procedure Laterality Date  . Tonsillectomy    . Tonsillectomy    . Inguinal hernia repair     Family History  Problem Relation Age of Onset  . Hypertension Other   . Hyperlipidemia Other    Social History  Substance Use Topics  . Smoking status: Never Smoker   . Smokeless tobacco: Never Used     Comment: Regular Exercise-Yes  . Alcohol Use: Yes     Comment: social   OB History    No data available     Review of Systems  All other systems reviewed and are negative.     Allergies  Adderall; Bactrim; and Prednisone  Home Medications   Prior to Admission medications   Medication Sig Start Date End Date Taking? Authorizing Provider  ibuprofen (ADVIL,MOTRIN) 800 MG tablet Take 1 tablet (800 mg total) by mouth every 8 (eight) hours as needed. Patient taking differently: Take 800 mg by mouth every 8 (eight) hours as needed (pain).  03/23/14  Yes Fayrene Helper, PA-C  Omega-3 Fatty Acids (FISH OIL PO) Take 1 capsule by mouth 3 (three) times daily with meals.   Yes Historical Provider, MD  ondansetron (ZOFRAN-ODT) 8 MG disintegrating tablet Take 8 mg by mouth every 8 (eight) hours as needed for nausea or vomiting.   Yes Historical Provider, MD  OVER THE COUNTER MEDICATION Take 2 capsules by mouth 2 (two) times daily with breakfast and lunch. Citrine - weight  management dietary supplement   Yes Historical Provider, MD  acetaminophen (TYLENOL) 500 MG tablet Take 500 mg by mouth once.    Historical Provider, MD  atomoxetine (STRATTERA) 25 MG capsule Take 1 capsule (25 mg total) by mouth daily. Patient not taking: Reported on 06/22/2015 09/09/12   Etta Grandchild, MD  cyclobenzaprine (FLEXERIL) 10 MG tablet Take 1 tablet (10 mg total) by mouth 3 (three) times daily as needed for muscle spasms. Patient not taking: Reported on 06/22/2015 03/23/14   Charm Rings, MD  ipratropium (ATROVENT) 0.06 % nasal spray Place 2 sprays into the nose 4 (four) times daily. Patient not taking: Reported on 06/22/2015 05/16/13   Linna Hoff, MD  omeprazole (PRILOSEC OTC) 20 MG tablet Take 20 mg by mouth once.    Historical Provider, MD  RaNITidine HCl (ZANTAC PO) Take 1 tablet by mouth once.    Historical Provider, MD  traMADol (ULTRAM) 50 MG tablet Take 1 tablet (50 mg total) by mouth every 6 (six) hours as needed for severe pain. Patient not taking: Reported on 06/22/2015 03/23/14   Fayrene Helper, PA-C   BP 101/73 mmHg  Pulse 86  Temp(Src) 98.5 F (36.9 C) (Oral)  Resp 18  Ht  (1.626 m)  Wt 138 lb  9.6 oz (62.869 kg)  BMI 23.78 kg/m2  SpO2 100%  LMP 06/11/2015 Physical Exam  Constitutional: She is oriented to person, place, and time. She appears well-developed and well-nourished. No distress.  HENT:  Head: Normocephalic and atraumatic.  Eyes: EOM are normal.  Neck: Normal range of motion.  Cardiovascular: Normal rate, regular rhythm and normal heart sounds.   Pulmonary/Chest: Effort normal and breath sounds normal.  Abdominal: Soft. She exhibits no distension. There is no tenderness.  Musculoskeletal: Normal range of motion.  Neurological: She is alert and oriented to person, place, and time.  Skin: Skin is warm and dry.  Psychiatric: She has a normal mood and affect. Judgment normal.  Nursing note and vitals reviewed.   ED Course  Procedures (including  critical care time) Labs Review Labs Reviewed  COMPREHENSIVE METABOLIC PANEL - Abnormal; Notable for the following:    ALT 12 (*)    All other components within normal limits  CBC  LIPASE, BLOOD  URINALYSIS, ROUTINE W REFLEX MICROSCOPIC (NOT AT [facility])  I-STAT BETA HCG BLOOD, ED ([facility], [facility], [facility] ONLY)    Imaging Review US Abdomen Complete  06/22/2015  CLINICAL DATA:  Right upper quadrant and epigastric abdominal pain and nausea for several days. EXAM: ABDOMEN ULTRASOUND COMPLETE COMPARISON:  Report from 11/10/2008 abdominal sonogram (images not available). 03/23/2014 chest CT angiogram. FINDINGS: Gallbladder: The nondistended gallbladder contains 21.1 cm layering shadowing calcified gallstones, with no gallbladder wall thickening, pericholecystic fluid or sonographic Murphy's sign. Common bile duct: Diameter: 2 mm Liver: No focal lesion identified. Within normal limits in parenchymal echogenicity. IVC: No abnormality visualized. Pancreas: Visualized portion unremarkable. Spleen: Size and appearance within normal limits. Right Kidney: Length: 10.5 cm. Echogenicity within normal limits. No mass or hydronephrosis visualized. Left Kidney: Length: 11.7 cm. Echogenicity within normal limits. Simple 1.5 cm renal cyst in the upper left kidney. No suspicious mass or hydronephrosis visualized. Abdominal aorta: No aneurysm visualized. Other findings: None. IMPRESSION: 1. Cholelithiasis. No evidence of acute cholecystitis. No biliary ductal dilatation. 2. Small simple left renal cysts. 3. Otherwise normal abdominal sonogram. Electronically Signed   By: Delbert Phenix M.D.   On: 06/22/2015 20:05   I have personally reviewed and evaluated these images and lab results as part of my medical decision-making.   EKG Interpretation None      MDM   Final diagnoses:  None    Cholelithiasis without signs of cholecystitis.  No evidence of choledocholithiasis.  LFTs, lipase, bili within normal limits.  White blood cell  count normal.  Patient stable for discharge home with outpatient general surgery follow-up.  Strict return precautions given.  Home with pain medication and nausea medication.  Patient placed on a bland diet    Azalia Bilis, MD 06/23/15 0030

## 2015-06-26 ENCOUNTER — Ambulatory Visit (INDEPENDENT_AMBULATORY_CARE_PROVIDER_SITE_OTHER): Payer: 59 | Admitting: Internal Medicine

## 2015-06-26 ENCOUNTER — Encounter: Payer: Self-pay | Admitting: Internal Medicine

## 2015-06-26 VITALS — BP 118/94 | HR 74 | Temp 98.1°F | Resp 16 | Ht 64.0 in | Wt 140.0 lb

## 2015-06-26 DIAGNOSIS — K8000 Calculus of gallbladder with acute cholecystitis without obstruction: Secondary | ICD-10-CM | POA: Diagnosis not present

## 2015-06-26 DIAGNOSIS — K8020 Calculus of gallbladder without cholecystitis without obstruction: Secondary | ICD-10-CM | POA: Insufficient documentation

## 2015-06-26 DIAGNOSIS — K2100 Gastro-esophageal reflux disease with esophagitis, without bleeding: Secondary | ICD-10-CM

## 2015-06-26 MED ORDER — OMEPRAZOLE 40 MG PO CPDR: 40.0000 mg | DELAYED_RELEASE_CAPSULE | Freq: Every day | ORAL | Status: DC

## 2015-06-26 MED ORDER — OXYCODONE-ACETAMINOPHEN 5-325 MG PO TABS: 1.0000 | ORAL_TABLET | ORAL | Status: DC | PRN

## 2015-06-26 NOTE — Patient Instructions (Signed)

## 2015-06-26 NOTE — Progress Notes (Signed)
Subjective:  Patient ID: Connie Freeman, female    DOB: 09/19/1965  Age: 50 y.o. MRN: [id number]  CC: Abdominal Pain and Gastroesophageal Reflux   HPI Connie Freeman presents for follow-up after a recent emergency room visit for bilateral upper quadrant abdominal pain. She tells me that she is feeling better. She was told that she has acid reflux disease and gallstones. She has had a few episodes of nausea but no vomiting. She denies fever, chills, loss of appetite, diarrhea, constipation, blood in her stools, or melena. She describes the pain as an intermittent stabbing sensation that is not related to intake of food. She thinks she has had a few stools that were dark but she has not been taking iron or Pepto-Bismol.  Outpatient Prescriptions Prior to Visit  Medication Sig Dispense Refill  . Omega-3 Fatty Acids (FISH OIL PO) Take 1 capsule by mouth 3 (three) times daily with meals.    . ondansetron (ZOFRAN ODT) 8 MG disintegrating tablet Take 1 tablet (8 mg total) by mouth every 8 (eight) hours as needed for nausea or vomiting. 12 tablet 0  . OVER THE COUNTER MEDICATION Take 2 capsules by mouth 2 (two) times daily with breakfast and lunch. Citrine - weight management dietary supplement    . ibuprofen (ADVIL,MOTRIN) 800 MG tablet Take 1 tablet (800 mg total) by mouth every 8 (eight) hours as needed. (Patient taking differently: Take 800 mg by mouth every 8 (eight) hours as needed (pain). ) 30 tablet 0  . omeprazole (PRILOSEC OTC) 20 MG tablet Take 20 mg by mouth once.    Marland Kitchen oxyCODONE-acetaminophen (PERCOCET/ROXICET) 5-325 MG tablet Take 1 tablet by mouth every 4 (four) hours as needed for severe pain. 20 tablet 0  . RaNITidine HCl (ZANTAC PO) Take 1 tablet by mouth once.     No facility-administered medications prior to visit.    ROS Review of Systems  Constitutional: Negative.  Negative for fever, chills, appetite change, fatigue and unexpected weight change.  HENT: Negative.   Negative for trouble swallowing.   Eyes: Negative.   Respiratory: Negative.  Negative for cough, choking, chest tightness, shortness of breath and stridor.   Cardiovascular: Negative.  Negative for chest pain, palpitations and leg swelling.  Gastrointestinal: Positive for abdominal pain. Negative for nausea, vomiting, diarrhea, constipation, blood in stool and abdominal distention.  Endocrine: Negative.   Genitourinary: Negative.  Negative for dysuria, urgency, hematuria and difficulty urinating.  Musculoskeletal: Negative.   Skin: Negative.   Allergic/Immunologic: Negative.   Neurological: Negative.  Negative for dizziness, tremors, weakness, light-headedness and numbness.  Hematological: Negative.  Negative for adenopathy. Does not bruise/bleed easily.  Psychiatric/Behavioral: Negative.     Objective:  BP 118/94 mmHg  Pulse 74  Temp(Src) 98.1 F (36.7 C) (Oral)  Resp 16  Ht  (1.626 m)  Wt 140 lb (63.504 kg)  BMI 24.02 kg/m2  SpO2 98%  LMP 06/11/2015  BP Readings from Last 3 Encounters:  06/26/15 118/94  06/22/15 126/88  03/23/14 128/76    Wt Readings from Last 3 Encounters:  06/26/15 140 lb (63.504 kg)  06/22/15 138 lb 9.6 oz (62.869 kg)  03/23/14 130 lb (58.968 kg)    Physical Exam  Constitutional: She is oriented to person, place, and time.  Non-toxic appearance. She does not have a sickly appearance. She does not appear ill. No distress.  HENT:  Mouth/Throat: Oropharynx is clear and moist. No oropharyngeal exudate.  Eyes: Conjunctivae are normal. Right eye exhibits no discharge. Left eye  exhibits no discharge. No scleral icterus.  Neck: Normal range of motion. Neck supple. No JVD present. No tracheal deviation present. No thyromegaly present.  Cardiovascular: Normal rate, regular rhythm, normal heart sounds and intact distal pulses.  Exam reveals no gallop and no friction rub.   No murmur heard. Pulmonary/Chest: Effort normal and breath sounds normal. No  stridor. No respiratory distress. She has no wheezes. She has no rales. She exhibits no tenderness.  Abdominal: Soft. Bowel sounds are normal. She exhibits no distension and no mass. There is no tenderness. There is no rebound and no guarding.  Genitourinary: Rectum normal. Rectal exam shows no external hemorrhoid, no internal hemorrhoid, no fissure, no mass, no tenderness and anal tone normal. Guaiac negative stool.  Musculoskeletal: Normal range of motion. She exhibits no edema or tenderness.  Lymphadenopathy:    She has no cervical adenopathy.  Neurological: She is oriented to person, place, and time.  Skin: Skin is warm and dry. No rash noted. She is not diaphoretic. No erythema. No pallor.  Psychiatric: She has a normal mood and affect. Her behavior is normal. Judgment and thought content normal.  Vitals reviewed.   Lab Results  Component Value Date   WBC 6.8 06/22/2015   HGB 12.4 06/22/2015   HCT 39.1 06/22/2015   PLT 357 06/22/2015   GLUCOSE 95 06/22/2015   CHOL 218* 03/07/2012   TRIG 125.0 03/07/2012   HDL 71.80 03/07/2012   LDLDIRECT 129.9 03/07/2012   LDLCALC 26 11/22/2009   ALT 12* 06/22/2015   AST 21 06/22/2015   NA 135 06/22/2015   K 4.5 06/22/2015   CL 102 06/22/2015   CREATININE 0.74 06/22/2015   BUN 8 06/22/2015   CO2 24 06/22/2015   TSH 0.58 03/07/2012    US Abdomen Complete  06/22/2015  CLINICAL DATA:  Right upper quadrant and epigastric abdominal pain and nausea for several days. EXAM: ABDOMEN ULTRASOUND COMPLETE COMPARISON:  Report from 11/10/2008 abdominal sonogram (images not available). 03/23/2014 chest CT angiogram. FINDINGS: Gallbladder: The nondistended gallbladder contains 21.1 cm layering shadowing calcified gallstones, with no gallbladder wall thickening, pericholecystic fluid or sonographic Murphy's sign. Common bile duct: Diameter: 2 mm Liver: No focal lesion identified. Within normal limits in parenchymal echogenicity. IVC: No abnormality  visualized. Pancreas: Visualized portion unremarkable. Spleen: Size and appearance within normal limits. Right Kidney: Length: 10.5 cm. Echogenicity within normal limits. No mass or hydronephrosis visualized. Left Kidney: Length: 11.7 cm. Echogenicity within normal limits. Simple 1.5 cm renal cyst in the upper left kidney. No suspicious mass or hydronephrosis visualized. Abdominal aorta: No aneurysm visualized. Other findings: None. IMPRESSION: 1. Cholelithiasis. No evidence of acute cholecystitis. No biliary ductal dilatation. 2. Small simple left renal cysts. 3. Otherwise normal abdominal sonogram. Electronically Signed   By: Delbert Phenix M.D.   On: 06/22/2015 20:05    Assessment & Plan:   Connie Freeman was seen today for abdominal pain and gastroesophageal reflux.  Diagnoses and all orders for this visit:  Calculus of gallbladder with acute cholecystitis without obstruction- she does not appear to have an acute issue with her gallbladder, I have asked her to see general surgery to consider having it removed. For now we'll continue Percocet as needed for pain. -     oxyCODONE-acetaminophen (PERCOCET/ROXICET) 5-325 MG tablet; Take 1 tablet by mouth every 4 (four) hours as needed for severe pain. -     Ambulatory referral to General Surgery  Gastroesophageal reflux disease with esophagitis- I've asked her to take a PPI consistently  for the next few months to improve her symptoms. There is no sign of GI bleeding. -     omeprazole (PRILOSEC) 40 MG capsule; Take 1 capsule (40 mg total) by mouth daily.   I have discontinued Connie Freeman's ibuprofen, RaNITidine HCl (ZANTAC PO), omeprazole, and ketorolac. I am also having her start on omeprazole. Additionally, I am having her maintain her OVER THE COUNTER MEDICATION, Omega-3 Fatty Acids (FISH OIL PO), ondansetron, and oxyCODONE-acetaminophen.  Meds ordered this encounter  Medications  . DISCONTD: ketorolac (TORADOL) 10 MG tablet    Sig: TK 1 T PO TID FOR 5  DAYS    Refill:  0  . oxyCODONE-acetaminophen (PERCOCET/ROXICET) 5-325 MG tablet    Sig: Take 1 tablet by mouth every 4 (four) hours as needed for severe pain.    Dispense:  25 tablet    Refill:  0  . omeprazole (PRILOSEC) 40 MG capsule    Sig: Take 1 capsule (40 mg total) by mouth daily.    Dispense:  90 capsule    Refill:  1     Follow-up: No Follow-up on file.  Sanda Linger, MD

## 2015-06-26 NOTE — Progress Notes (Signed)
Pre visit review using our clinic review tool, if applicable. No additional management support is needed unless otherwise documented below in the visit note. 

## 2015-07-15 ENCOUNTER — Ambulatory Visit: Payer: 59 | Admitting: Internal Medicine

## 2015-09-06 ENCOUNTER — Other Ambulatory Visit: Payer: Self-pay

## 2015-09-06 DIAGNOSIS — Z1231 Encounter for screening mammogram for malignant neoplasm of breast: Secondary | ICD-10-CM

## 2015-10-07 ENCOUNTER — Ambulatory Visit: Payer: 59

## 2015-10-24 ENCOUNTER — Telehealth: Payer: Self-pay | Admitting: Internal Medicine

## 2015-10-24 NOTE — Telephone Encounter (Signed)
I can't get to this today 

## 2015-10-24 NOTE — Telephone Encounter (Signed)
Tried to contact patient and vm full

## 2015-10-24 NOTE — Telephone Encounter (Signed)
Patient states she is having pain in her breast.  She is worried about breast cancer.  She does not want to see anyone else.  She is requesting to be worked in today.

## 2015-10-25 NOTE — Telephone Encounter (Signed)
Tried to call patient again and mb full

## 2016-01-31 IMAGING — CR DG CHEST 2V
1 series · 2 of 2 positions shown · non-contrast
Comparison: PA and lateral chest of May 23, 2012

CLINICAL DATA: Three-day 4 days of chest pain following a group
light illness ; residual shortness of breath

EXAM:
CHEST  2 VIEW

[Series 1: pa · 0.17mm/px · 2 of 2 slices shown]
[im 1/2]
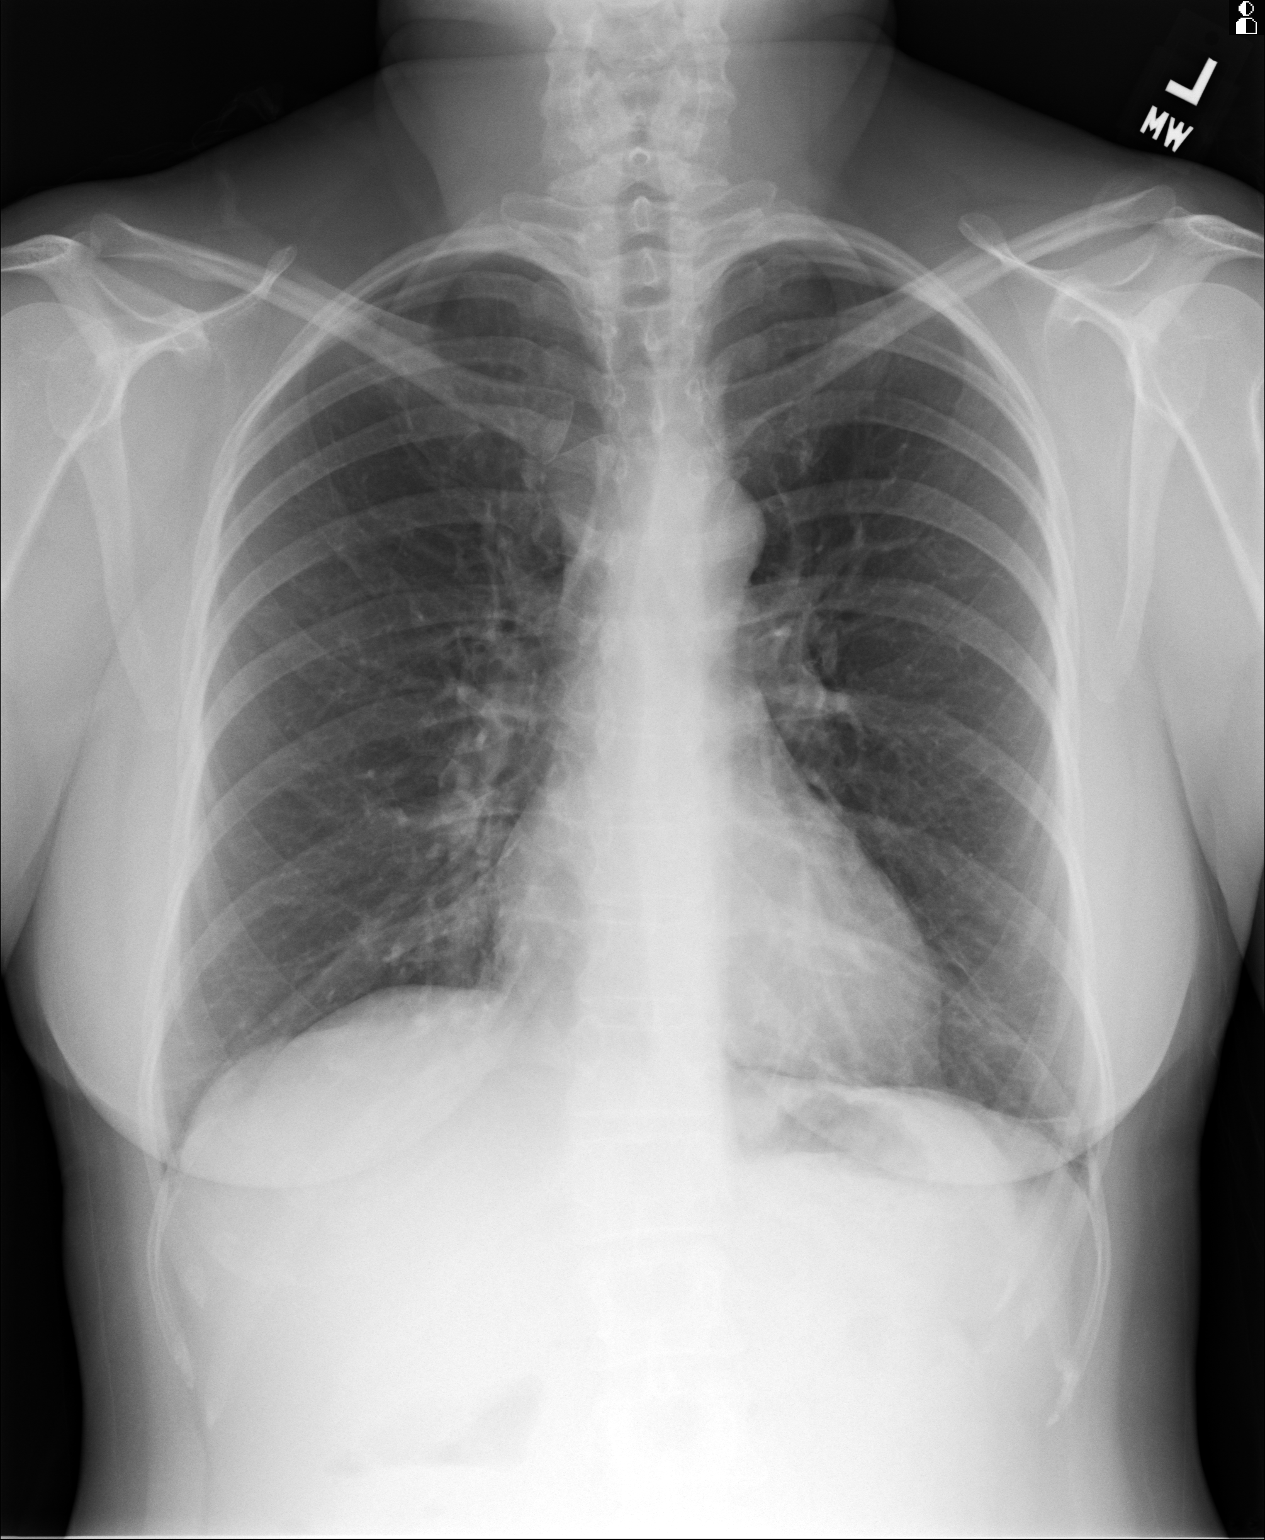
[im 2/2]
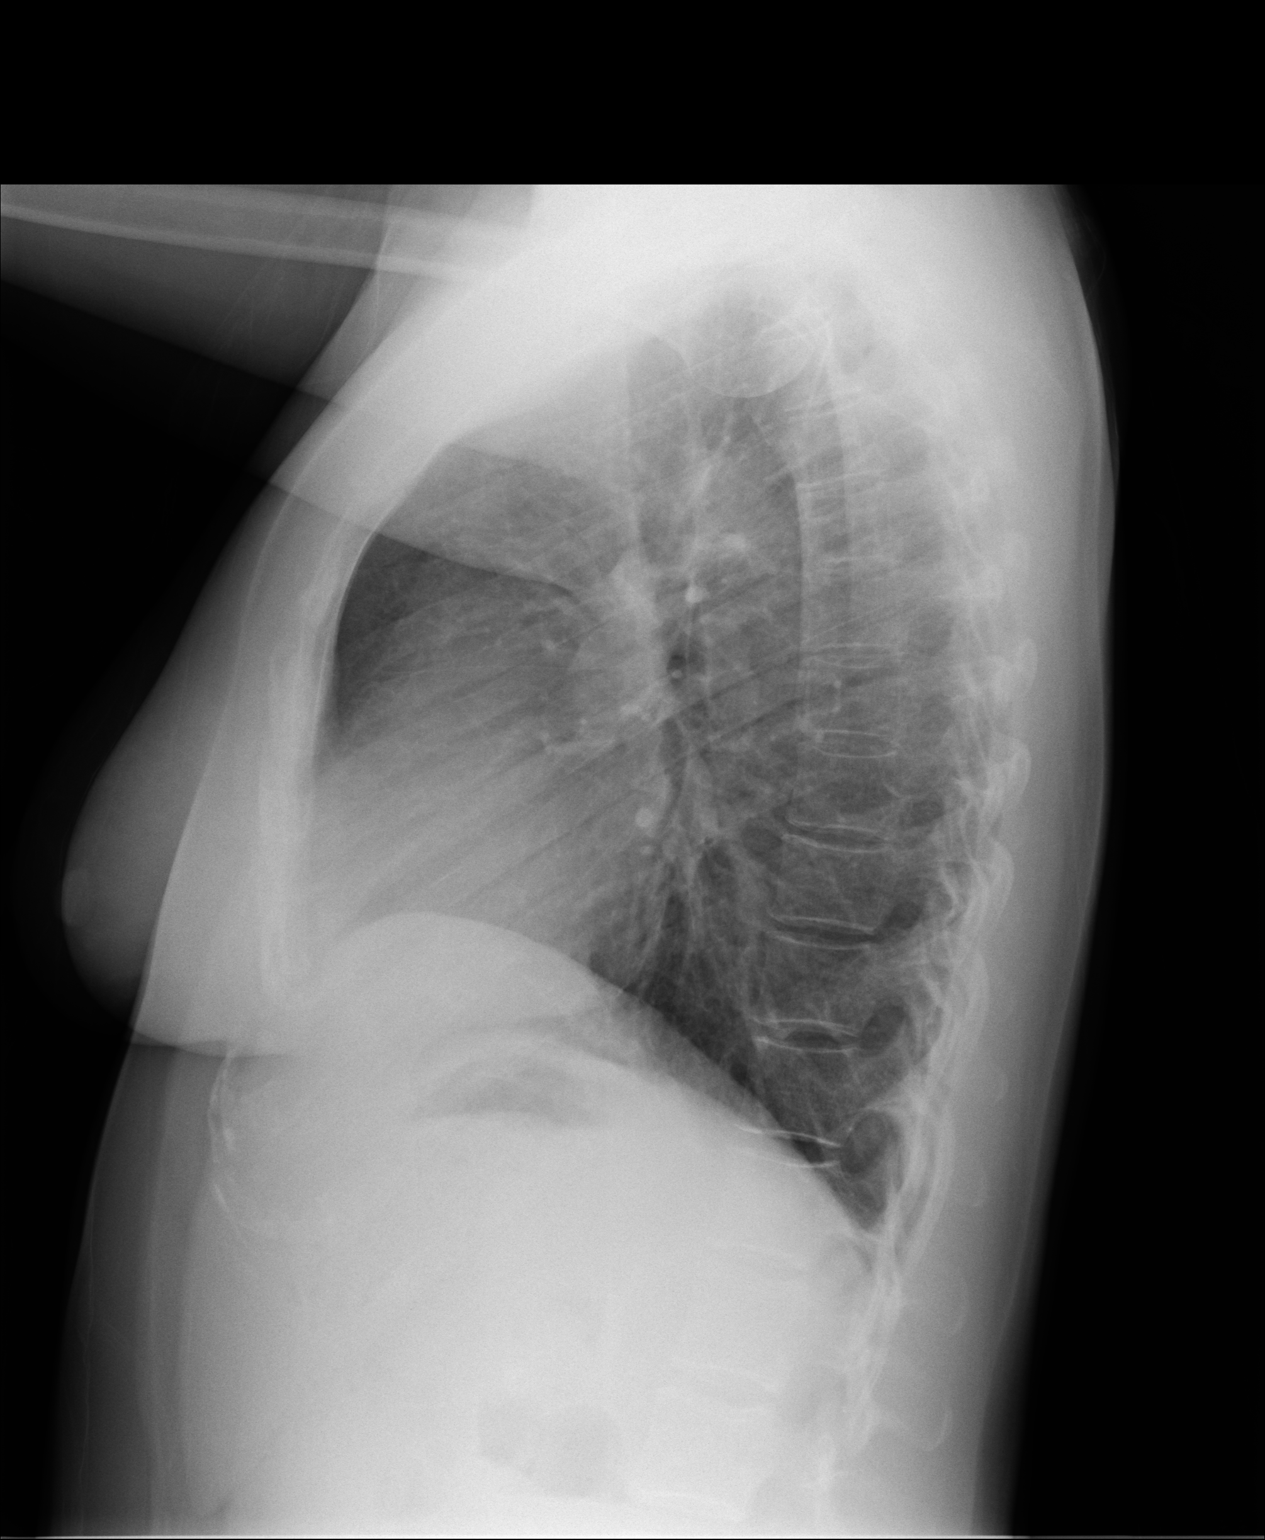

[2 of 2 positions shown; findings below may reference images not displayed]

FINDINGS: The lungs are well-expanded. There is minimal linear increased
density just above the left lateral costophrenic angle new since the
earlier study. The heart and pulmonary vascularity are normal. The
mediastinum is normal in width. There is no pleural effusion or
pneumothorax. The trachea is midline. The bony thorax is
unremarkable.
IMPRESSION: There is no evidence of pneumonia nor pleural effusion. There is
minimal subsegmental atelectasis versus scarring at the left lung
base laterally.

## 2016-02-19 ENCOUNTER — Ambulatory Visit: Payer: 59 | Admitting: Internal Medicine

## 2016-02-20 ENCOUNTER — Ambulatory Visit (INDEPENDENT_AMBULATORY_CARE_PROVIDER_SITE_OTHER): Payer: 59 | Admitting: Internal Medicine

## 2016-02-20 ENCOUNTER — Other Ambulatory Visit: Payer: 59

## 2016-02-20 ENCOUNTER — Encounter: Payer: Self-pay | Admitting: Internal Medicine

## 2016-02-20 VITALS — BP 118/80 | HR 75 | Temp 97.9°F | Resp 16 | Ht 64.0 in | Wt 137.5 lb

## 2016-02-20 DIAGNOSIS — Z23 Encounter for immunization: Secondary | ICD-10-CM

## 2016-02-20 DIAGNOSIS — K8000 Calculus of gallbladder with acute cholecystitis without obstruction: Secondary | ICD-10-CM

## 2016-02-20 DIAGNOSIS — B3731 Acute candidiasis of vulva and vagina: Secondary | ICD-10-CM

## 2016-02-20 DIAGNOSIS — R300 Dysuria: Secondary | ICD-10-CM

## 2016-02-20 DIAGNOSIS — F5104 Psychophysiologic insomnia: Secondary | ICD-10-CM | POA: Insufficient documentation

## 2016-02-20 DIAGNOSIS — K589 Irritable bowel syndrome without diarrhea: Secondary | ICD-10-CM | POA: Diagnosis not present

## 2016-02-20 DIAGNOSIS — B373 Candidiasis of vulva and vagina: Secondary | ICD-10-CM

## 2016-02-20 LAB — POC URINALSYSI DIPSTICK (AUTOMATED)
Bilirubin, UA: NEGATIVE
Blood, UA: NEGATIVE
GLUCOSE UA: NEGATIVE
Ketones, UA: NEGATIVE
Leukocytes, UA: NEGATIVE
NITRITE UA: NEGATIVE
PROTEIN UA: NEGATIVE
Spec Grav, UA: 1.025
UROBILINOGEN UA: 0.2
pH, UA: 6

## 2016-02-20 MED ORDER — TRAZODONE HCL 100 MG PO TABS: 100.0000 mg | ORAL_TABLET | Freq: Every day | ORAL | 5 refills | Status: DC

## 2016-02-20 MED ORDER — GLYCOPYRROLATE 2 MG PO TABS: 2.0000 mg | ORAL_TABLET | Freq: Two times a day (BID) | ORAL | 5 refills | Status: AC

## 2016-02-20 NOTE — Progress Notes (Signed)
Pre visit review using our clinic review tool, if applicable. No additional management support is needed unless otherwise documented below in the visit note. 

## 2016-02-20 NOTE — Patient Instructions (Signed)

## 2016-02-20 NOTE — Progress Notes (Signed)
Subjective:  Patient ID: Connie Freeman, female    DOB: 1965/06/24  Age: 50 y.o. MRN: [id number]  CC: Abdominal Pain and Urinary Tract Infection   HPI Connie Freeman presents for follow-up on a recent urinary tract infection. She was seen elsewhere at an urgent care center and was prescribed Cipro. I have no information from that visit. She complains of scant vaginal discharge but denies dysuria, hematuria, flank pain, urgency, fever, chills, or vomiting. She has mild nausea and chronic, diffuse abdominal pain. She has a history of gallstones but tells me her pain is not exacerbated by eating. She denies weight loss, diarrhea, or constipation.  She also complains of insomnia and anxiety. 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 She has therefore developed insomnia, having trouble sleeping during the day.  Outpatient Medications Prior to Visit  Medication Sig Dispense Refill  . Omega-3 Fatty Acids (FISH OIL PO) Take 1 capsule by mouth 3 (three) times daily with meals.    Marland Kitchen omeprazole (PRILOSEC) 40 MG capsule Take 1 capsule (40 mg total) by mouth daily. 90 capsule 1  . ondansetron (ZOFRAN ODT) 8 MG disintegrating tablet Take 1 tablet (8 mg total) by mouth every 8 (eight) hours as needed for nausea or vomiting. 12 tablet 0  . OVER THE COUNTER MEDICATION Take 2 capsules by mouth 2 (two) times daily with breakfast and lunch. Citrine - weight management dietary supplement    . oxyCODONE-acetaminophen (PERCOCET/ROXICET) 5-325 MG tablet Take 1 tablet by mouth every 4 (four) hours as needed for severe pain. 25 tablet 0   No facility-administered medications prior to visit.     ROS Review of Systems  Constitutional: Negative.  Negative for activity change, appetite change, diaphoresis, fatigue and fever.  HENT: Negative.   Eyes: Negative.   Respiratory: Negative.  Negative for cough, choking, chest tightness, shortness of breath and stridor.     Cardiovascular: Negative.  Negative for chest pain, palpitations and leg swelling.  Gastrointestinal: Positive for abdominal distention. Negative for abdominal pain, blood in stool, constipation, diarrhea, nausea and vomiting.  Endocrine: Negative.   Genitourinary: Positive for vaginal discharge. Negative for decreased urine volume, difficulty urinating, dysuria, flank pain, frequency, hematuria, pelvic pain, urgency, vaginal bleeding and vaginal pain.  Musculoskeletal: Negative.  Negative for back pain and myalgias.  Skin: Negative.  Negative for color change and rash.  Allergic/Immunologic: Negative.   Neurological: Negative.   Hematological: Negative.  Negative for adenopathy. Does not bruise/bleed easily.  Psychiatric/Behavioral: Positive for sleep disturbance. Negative for agitation, behavioral problems, decreased concentration, dysphoric mood, self-injury and suicidal ideas. The patient is nervous/anxious.     Objective:  BP 118/80 (BP Location: Left Arm, Patient Position: Sitting, Cuff Size: Normal)   Pulse 75   Temp 97.9 F (36.6 C) (Oral)   Resp 16   Ht 5\' 4"  (1.626 m)   Wt 137 lb 8 oz (62.4 kg)   LMP 01/28/2016 (Approximate)   SpO2 98%   BMI 23.60 kg/m   BP Readings from Last 3 Encounters:  02/20/16 118/80  06/26/15 (!) 118/94  06/22/15 126/88    Wt Readings from Last 3 Encounters:  02/20/16 137 lb 8 oz (62.4 kg)  06/26/15 140 lb (63.5 kg)  06/22/15 138 lb 9.6 oz (62.9 kg)    Physical Exam  Constitutional: She is oriented to person, place, and time. No distress.  HENT:  Mouth/Throat: Oropharynx is clear and moist. No oropharyngeal exudate.  Eyes: Conjunctivae are  normal. Right eye exhibits no discharge. Left eye exhibits no discharge. No scleral icterus.  Neck: Normal range of motion. Neck supple. No JVD present. No tracheal deviation present. No thyromegaly present.  Cardiovascular: Normal rate, regular rhythm, normal heart sounds and intact distal pulses.   Exam reveals no gallop and no friction rub.   No murmur heard. Pulmonary/Chest: Effort normal and breath sounds normal. No stridor. No respiratory distress. She has no wheezes. She has no rales. She exhibits no tenderness.  Abdominal: Soft. Bowel sounds are normal. She exhibits no distension and no mass. There is no tenderness. There is no rebound and no guarding.  Musculoskeletal: Normal range of motion. She exhibits no edema, tenderness or deformity.  Lymphadenopathy:    She has no cervical adenopathy.  Neurological: She is oriented to person, place, and time.  Skin: Skin is warm and dry. No rash noted. She is not diaphoretic. No erythema. No pallor.  Psychiatric: She has a normal mood and affect. Her behavior is normal. Judgment and thought content normal.  Vitals reviewed.   Lab Results  Component Value Date   WBC 6.8 06/22/2015   HGB 12.4 06/22/2015   HCT 39.1 06/22/2015   PLT 357 06/22/2015   GLUCOSE 95 06/22/2015   CHOL 218 (H) 03/07/2012   TRIG 125.0 03/07/2012   HDL 71.80 03/07/2012   LDLDIRECT 129.9 03/07/2012   LDLCALC 26 11/22/2009   ALT 12 (L) 06/22/2015   AST 21 06/22/2015   NA 135 06/22/2015   K 4.5 06/22/2015   CL 102 06/22/2015   CREATININE 0.74 06/22/2015   BUN 8 06/22/2015   CO2 24 06/22/2015   TSH 0.58 03/07/2012    [place] Abdomen Complete  Result Date: 06/22/2015 CLINICAL DATA:  Right upper quadrant and epigastric abdominal pain and nausea for several days. EXAM: ABDOMEN ULTRASOUND COMPLETE COMPARISON:  Report from 11/10/2008 abdominal sonogram (images not available). 03/23/2014 chest CT angiogram. FINDINGS: Gallbladder: The nondistended gallbladder contains 21.1 cm layering shadowing calcified gallstones, with no gallbladder wall thickening, pericholecystic fluid or sonographic Murphy's sign. Common bile duct: Diameter: 2 mm Liver: No focal lesion identified. Within normal limits in parenchymal echogenicity. IVC: No abnormality visualized. Pancreas: Visualized  portion unremarkable. Spleen: Size and appearance within normal limits. Right Kidney: Length: 10.5 cm. Echogenicity within normal limits. No mass or hydronephrosis visualized. Left Kidney: Length: 11.7 cm. Echogenicity within normal limits. Simple 1.5 cm renal cyst in the upper left kidney. No suspicious mass or hydronephrosis visualized. Abdominal aorta: No aneurysm visualized. Other findings: None. IMPRESSION: 1. Cholelithiasis. No evidence of acute cholecystitis. No biliary ductal dilatation. 2. Small simple left renal cysts. 3. Otherwise normal abdominal sonogram. Electronically Signed   By: Delbert PhenixJason A Poff M.D.   On: 06/22/2015 20:05    Assessment & Plan:   Connie JesterMichele was seen today for abdominal pain and urinary tract infection.  Diagnoses and all orders for this visit:  Dysuria- her urinalysis is normal, her urine culture is remarkable only for yeast. -     POCT Urinalysis Dipstick (Automated) -     CULTURE, URINE COMPREHENSIVE; Future  Need for prophylactic vaccination and inoculation against influenza -     Flu Vaccine QUAD 36+ mos IM  Calculus of gallbladder with acute cholecystitis without obstruction- I think the abdominal pain is most likely IBS but I will re-ultrasound her gallbladder to see if she has recurrence of gallstones or evidence of cholecystitis. -     US Abdomen Complete; Future  Psychophysiological insomnia -  traZODone (DESYREL) 100 MG tablet; Take 1 tablet (100 mg total) by mouth at bedtime.  Irritable bowel syndrome without diarrhea -     glycopyrrolate (ROBINUL) 2 MG tablet; Take 1 tablet (2 mg total) by mouth 2 (two) times daily.  Yeast vaginitis -     fluconazole (DIFLUCAN) 150 MG tablet; Take 1 tablet (150 mg total) by mouth once.   I have discontinued Connie Freeman's OVER THE COUNTER MEDICATION, Omega-3 Fatty Acids (FISH OIL PO), ondansetron, oxyCODONE-acetaminophen, and omeprazole. I am also having her start on glycopyrrolate, traZODone, and fluconazole.  Additionally, I am having her maintain her NON FORMULARY.  Meds ordered this encounter  Medications  . NON FORMULARY    Sig: Supplement: Brahmi aka Bacopa  . glycopyrrolate (ROBINUL) 2 MG tablet    Sig: Take 1 tablet (2 mg total) by mouth 2 (two) times daily.    Dispense:  60 tablet    Refill:  5  . traZODone (DESYREL) 100 MG tablet    Sig: Take 1 tablet (100 mg total) by mouth at bedtime.    Dispense:  30 tablet    Refill:  5  . fluconazole (DIFLUCAN) 150 MG tablet    Sig: Take 1 tablet (150 mg total) by mouth once.    Dispense:  1 tablet    Refill:  3     Follow-up: Return in about 3 weeks (around 03/12/2016).  Sanda Linger, MD

## 2016-02-21 LAB — CULTURE, URINE COMPREHENSIVE

## 2016-02-22 DIAGNOSIS — B3731 Acute candidiasis of vulva and vagina: Secondary | ICD-10-CM | POA: Insufficient documentation

## 2016-02-22 DIAGNOSIS — B373 Candidiasis of vulva and vagina: Secondary | ICD-10-CM | POA: Insufficient documentation

## 2016-02-22 MED ORDER — FLUCONAZOLE 150 MG PO TABS: 150.0000 mg | ORAL_TABLET | Freq: Once | ORAL | 3 refills | Status: AC

## 2016-02-25 ENCOUNTER — Telehealth: Payer: Self-pay

## 2016-02-25 NOTE — Telephone Encounter (Signed)
-----   Message from Phetcharat Noitamyae sent at 02/25/2016 12:37 PM EDT ----- Pt returning your call to let you know that she is taking med that we sent in for yeast infection already.

## 2016-03-03 ENCOUNTER — Inpatient Hospital Stay: Admission: RE | Admit: 2016-03-03 | Payer: 59 | Source: Ambulatory Visit

## 2016-03-13 ENCOUNTER — Telehealth: Payer: Self-pay | Admitting: Internal Medicine

## 2016-03-13 ENCOUNTER — Ambulatory Visit
Admission: RE | Admit: 2016-03-13 | Discharge: 2016-03-13 | Disposition: A | Discharge status: Home / Self Care | Payer: 59 | Source: Ambulatory Visit | Attending: Internal Medicine | Admitting: Internal Medicine

## 2016-03-13 DIAGNOSIS — K8000 Calculus of gallbladder with acute cholecystitis without obstruction: Secondary | ICD-10-CM

## 2016-03-13 NOTE — Telephone Encounter (Signed)
Pt called request ultrasound result that was done today. Pt stating ultrasound tech told her that she could get the result today, advise pt Dr. Yetta BarreJones it not in the office today. Please call her back

## 2016-03-15 ENCOUNTER — Other Ambulatory Visit: Payer: Self-pay | Admitting: Internal Medicine

## 2016-03-15 DIAGNOSIS — K8020 Calculus of gallbladder without cholecystitis without obstruction: Secondary | ICD-10-CM

## 2016-03-16 NOTE — Telephone Encounter (Signed)
See imaging note. Pt has been informed.

## 2016-04-21 ENCOUNTER — Ambulatory Visit (INDEPENDENT_AMBULATORY_CARE_PROVIDER_SITE_OTHER): Payer: 59 | Admitting: Internal Medicine

## 2016-04-21 ENCOUNTER — Encounter: Payer: Self-pay | Admitting: Internal Medicine

## 2016-04-21 ENCOUNTER — Other Ambulatory Visit: Payer: 59

## 2016-04-21 VITALS — BP 120/78 | HR 98 | Temp 97.7°F | Resp 16 | Ht 64.0 in | Wt 133.2 lb

## 2016-04-21 DIAGNOSIS — J0100 Acute maxillary sinusitis, unspecified: Secondary | ICD-10-CM

## 2016-04-21 DIAGNOSIS — Z111 Encounter for screening for respiratory tuberculosis: Secondary | ICD-10-CM

## 2016-04-21 DIAGNOSIS — F5104 Psychophysiologic insomnia: Secondary | ICD-10-CM

## 2016-04-21 DIAGNOSIS — Z0000 Encounter for general adult medical examination without abnormal findings: Secondary | ICD-10-CM

## 2016-04-21 MED ORDER — PROMETHAZINE-DM 6.25-15 MG/5ML PO SYRP: 5.0000 mL | ORAL_SOLUTION | Freq: Four times a day (QID) | ORAL | 0 refills | Status: AC | PRN

## 2016-04-21 MED ORDER — TRAZODONE HCL 100 MG PO TABS: 100.0000 mg | ORAL_TABLET | Freq: Every day | ORAL | 5 refills | Status: AC

## 2016-04-21 MED ORDER — AZITHROMYCIN 500 MG PO TABS: 500.0000 mg | ORAL_TABLET | Freq: Every day | ORAL | 0 refills | Status: AC

## 2016-04-21 NOTE — Progress Notes (Signed)
Pre visit review using our clinic review tool, if applicable. No additional management support is needed unless otherwise documented below in the visit note. 

## 2016-04-21 NOTE — Progress Notes (Signed)
Subjective:  Patient ID: Connie Freeman, female    DOB: Oct 10, 1965  Age: 50 y.o. MRN: [id number]  CC: URI   HPI Connie Freeman presents for 3 day history of sore throat, laryngitis, mild headache but no nausea or vomiting, cough productive of yellow phlegm and runny nose productive of yellow phlegm, and fatigue.  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  Outpatient Medications Prior to Visit  Medication Sig Dispense Refill  . glycopyrrolate (ROBINUL) 2 MG tablet Take 1 tablet (2 mg total) by mouth 2 (two) times daily. 60 tablet 5  . NON FORMULARY Supplement: Brahmi aka Bacopa    . traZODone (DESYREL) 100 MG tablet Take 1 tablet (100 mg total) by mouth at bedtime. 30 tablet 5   No facility-administered medications prior to visit.     ROS Review of Systems  Constitutional: Negative for chills, diaphoresis, fatigue and fever.  HENT: Positive for congestion, postnasal drip, rhinorrhea, sinus pressure and sore throat. Negative for facial swelling and trouble swallowing.   Respiratory: Positive for choking. Negative for chest tightness, shortness of breath, wheezing and stridor.   Cardiovascular: Negative.  Negative for chest pain, palpitations and leg swelling.  Gastrointestinal: Negative for abdominal pain, constipation, diarrhea, nausea and vomiting.  Endocrine: Negative.   Genitourinary: Negative.   Musculoskeletal: Negative.  Negative for back pain, myalgias and neck pain.  Skin: Negative.  Negative for rash.  Allergic/Immunologic: Negative.   Neurological: Negative.   Hematological: Negative.  Negative for adenopathy. Does not bruise/bleed easily.  Psychiatric/Behavioral: Positive for sleep disturbance. Negative for dysphoric mood and suicidal ideas. The patient is not nervous/anxious.     Objective:  BP 120/78 (BP Location: Right Arm, Patient Position: Sitting, Cuff Size: Normal)    Pulse 98   Temp 97.7 F (36.5 C) (Oral)   Resp 16   Ht 5' 4"  (1.626 m)   Wt 133 lb 4 oz (60.4 kg)   LMP 04/09/2016   SpO2 96%   BMI 22.87 kg/m   BP Readings from Last 3 Encounters:  04/21/16 120/78  02/20/16 118/80  06/26/15 (!) 118/94    Wt Readings from Last 3 Encounters:  04/21/16 133 lb 4 oz (60.4 kg)  02/20/16 137 lb 8 oz (62.4 kg)  06/26/15 140 lb (63.5 kg)    Physical Exam  Constitutional: She is oriented to person, place, and time. She appears well-developed and well-nourished.  Non-toxic appearance. She does not have a sickly appearance. She does not appear ill. No distress.  HENT:  Mouth/Throat: Mucous membranes are normal. No uvula swelling. Posterior oropharyngeal erythema present. No oropharyngeal exudate, posterior oropharyngeal edema or tonsillar abscesses.  Eyes: Conjunctivae are normal. Right eye exhibits no discharge. Left eye exhibits no discharge. No scleral icterus.  Neck: Normal range of motion. Neck supple. No JVD present. No tracheal deviation present. No thyromegaly present.  Cardiovascular: Normal rate, regular rhythm, normal heart sounds and intact distal pulses.  Exam reveals no gallop and no friction rub.   No murmur heard. Pulmonary/Chest: Effort normal and breath sounds normal. No stridor. No respiratory distress. She has no wheezes. She has no rales. She exhibits no tenderness.  Abdominal: Soft. Bowel sounds are normal. She exhibits no distension and no mass. There is no tenderness. There is no rebound and no guarding.  Musculoskeletal: Normal range of motion. She exhibits no edema, tenderness or deformity.  Lymphadenopathy:  She has no cervical adenopathy.  Neurological: She is oriented to person, place, and time.  Skin: Skin is warm and dry. No rash noted. She is not diaphoretic. No erythema. No pallor.  Vitals reviewed.   Lab Results  Component Value Date   WBC 6.8 06/22/2015   HGB 12.4 06/22/2015   HCT 39.1 06/22/2015   PLT 357  06/22/2015   GLUCOSE 95 06/22/2015   CHOL 218 (H) 03/07/2012   TRIG 125.0 03/07/2012   HDL 71.80 03/07/2012   LDLDIRECT 129.9 03/07/2012   LDLCALC 26 11/22/2009   ALT 12 (L) 06/22/2015   AST 21 06/22/2015   NA 135 06/22/2015   K 4.5 06/22/2015   CL 102 06/22/2015   CREATININE 0.74 06/22/2015   BUN 8 06/22/2015   CO2 24 06/22/2015   TSH 0.58 03/07/2012    US Abdomen Complete  Result Date: 03/13/2016 CLINICAL DATA:  Abdominal bloating for 6 months. History of cholelithiasis. EXAM: ABDOMEN ULTRASOUND COMPLETE COMPARISON:  June 22, 2015 FINDINGS: Gallbladder: Within the gallbladder, there are echogenic foci which move and shadow consistent with cholelithiasis. Largest gallstone measures 18 mm in length. There is no gallbladder wall thickening or pericholecystic fluid. No sonographic Murphy sign noted by sonographer. Common bile duct: Diameter: 3 mm. No intrahepatic or extrahepatic biliary duct dilatation. Liver: No focal lesion identified. Within normal limits in parenchymal echogenicity. IVC: No abnormality visualized. Pancreas: There is no pancreatic mass or inflammatory focus. Spleen: Size and appearance within normal limits. Right Kidney: Length: 10.0 cm. Echogenicity within normal limits. No mass or hydronephrosis visualized. Left Kidney: Length: 11.0 cm. Echogenicity within normal limits. No hydronephrosis visualized. There is a cyst arising from the upper pole left kidney measuring 1.3 x 1.4 x 1.4 cm. Abdominal aorta: No aneurysm visualized. Other findings: No demonstrable ascites. IMPRESSION: Cholelithiasis. Small cyst upper pole left kidney. Study otherwise unremarkable. Electronically Signed   By: Lowella Grip III M.D.   On: 03/13/2016 14:03    Assessment & Plan:   Malayjah was seen today for uri.  Diagnoses and all orders for this visit:  Psychophysiological insomnia -     traZODone (DESYREL) 100 MG tablet; Take 1 tablet (100 mg total) by mouth at bedtime.  Routine  general medical examination at a health care facility -     Hepatitis B surface antibody; Future -     Hepatitis A antibody, total; Future -     Mumps antibody, IgG; Future -     Rubeola antibody IgG; Future -     Quantiferon tb gold assay (blood); Future -     Rubella screen; Future  Acute non-recurrent maxillary sinusitis -     promethazine-dextromethorphan (PROMETHAZINE-DM) 6.25-15 MG/5ML syrup; Take 5 mLs by mouth 4 (four) times daily as needed for cough. -     azithromycin (ZITHROMAX) 500 MG tablet; Take 1 tablet (500 mg total) by mouth daily.   I am having Ms. Conley Canal start on promethazine-dextromethorphan and azithromycin. I am also having her maintain her NON FORMULARY, glycopyrrolate, and traZODone.  Meds ordered this encounter  Medications  . traZODone (DESYREL) 100 MG tablet    Sig: Take 1 tablet (100 mg total) by mouth at bedtime.    Dispense:  30 tablet    Refill:  5  . promethazine-dextromethorphan (PROMETHAZINE-DM) 6.25-15 MG/5ML syrup    Sig: Take 5 mLs by mouth 4 (four) times daily as needed for cough.    Dispense:  118 mL    Refill:  0  . azithromycin (  ZITHROMAX) 500 MG tablet    Sig: Take 1 tablet (500 mg total) by mouth daily.    Dispense:  3 tablet    Refill:  0     Follow-up: No Follow-up on file.  Scarlette Calico, MD

## 2016-04-21 NOTE — Patient Instructions (Signed)
Upper Respiratory Infection, Adult Most upper respiratory infections (URIs) are caused by a virus. A URI affects the nose, throat, and upper air passages. The most common type of URI is often called "the common cold." Follow these instructions at home:  Take medicines only as told by your doctor.  Gargle warm saltwater or take cough drops to comfort your throat as told by your doctor.  Use a warm mist humidifier or inhale steam from a shower to increase air moisture. This may make it easier to breathe.  Drink enough fluid to keep your pee (urine) clear or pale yellow.  Eat soups and other clear broths.  Have a healthy diet.  Rest as needed.  Go back to work when your fever is gone or your doctor says it is okay.  You may need to stay home longer to avoid giving your URI to others.  You can also wear a face mask and wash your hands often to prevent spread of the virus.  Use your inhaler more if you have asthma.  Do not use any tobacco products, including cigarettes, chewing tobacco, or electronic cigarettes. If you need help quitting, ask your doctor. Contact a doctor if:  You are getting worse, not better.  Your symptoms are not helped by medicine.  You have chills.  You are getting more short of breath.  You have brown or red mucus.  You have yellow or brown discharge from your nose.  You have pain in your face, especially when you bend forward.  You have a fever.  You have puffy (swollen) neck glands.  You have pain while swallowing.  You have white areas in the back of your throat. Get help right away if:  You have very bad or constant:  Headache.  Ear pain.  Pain in your forehead, behind your eyes, and over your cheekbones (sinus pain).  Chest pain.  You have long-lasting (chronic) lung disease and any of the following:  Wheezing.  Long-lasting cough.  Coughing up blood.  A change in your usual mucus.  You have a stiff neck.  You have  changes in your:  Vision.  Hearing.  Thinking.  Mood. This information is not intended to replace advice given to you by your health care provider. Make sure you discuss any questions you have with your health care provider. Document Released: 10/07/2007 Document Revised: 12/22/2015 Document Reviewed: 07/26/2013 Elsevier Interactive Patient Education  2017 [employer]  

## 2016-04-22 LAB — MUMPS ANTIBODY, IGG: Mumps IgG: 26 AU/mL — ABNORMAL HIGH (ref <9.00)

## 2016-04-22 LAB — HEPATITIS A ANTIBODY, TOTAL: HEP A TOTAL AB: BORDERLINE — AB

## 2016-04-22 LAB — RUBEOLA ANTIBODY IGG: Rubeola IgG: >300 AU/mL — ABNORMAL HIGH (ref <25.00)

## 2016-04-22 LAB — RUBELLA SCREEN: RUBELLA: 6.27 {index} — AB (ref <0.90)

## 2016-04-22 LAB — HEPATITIS B SURFACE ANTIBODY, QUANTITATIVE

## 2016-04-23 ENCOUNTER — Encounter: Payer: Self-pay | Admitting: Internal Medicine

## 2016-04-23 LAB — QUANTIFERON TB GOLD ASSAY (BLOOD)
INTERFERON GAMMA RELEASE ASSAY: NEGATIVE
Mitogen-Nil: 8.33 IU/mL
QUANTIFERON TB AG MINUS NIL: 0.27 [IU]/mL
Quantiferon Nil Value: 0.04 IU/mL

## 2016-04-24 ENCOUNTER — Other Ambulatory Visit: Payer: Self-pay | Admitting: Family

## 2016-04-24 ENCOUNTER — Telehealth: Payer: Self-pay

## 2016-04-24 LAB — TB SKIN TEST
Induration: 0 mm
TB Skin Test: NEGATIVE

## 2016-04-24 MED ORDER — HYDROCODONE-HOMATROPINE 5-1.5 MG/5ML PO SYRP: 5.0000 mL | ORAL_SOLUTION | Freq: Three times a day (TID) | ORAL | 0 refills | Status: DC | PRN

## 2016-04-24 MED ORDER — PROMETHAZINE-CODEINE 6.25-10 MG/5ML PO SYRP: 5.0000 mL | ORAL_SOLUTION | Freq: Four times a day (QID) | ORAL | 0 refills | Status: AC | PRN

## 2016-04-24 NOTE — Telephone Encounter (Signed)
Pt is requesting another rx for the cough syrup that was rxed on Tuesday.  PCP is out office - can you advise in his absence please?

## 2016-04-24 NOTE — Telephone Encounter (Signed)
Hycodan available for pick up.

## 2016-04-25 NOTE — Telephone Encounter (Signed)
Tammy SoursGreg rewrote for the promethazine. [facility]

## 2016-08-31 IMAGING — US US ABDOMEN COMPLETE
1 series · 13 of 25 positions shown · non-contrast
Comparison: Report from 11/10/2008 abdominal sonogram (images not
available). 03/23/2014 chest CT angiogram.

CLINICAL DATA: Right upper quadrant and epigastric abdominal pain
and nausea for several days.

EXAM:
ABDOMEN ULTRASOUND COMPLETE

[Series 1: us abdomen complete · 0.15mm/px · 13 of 81 slices shown]
[im 1/81]
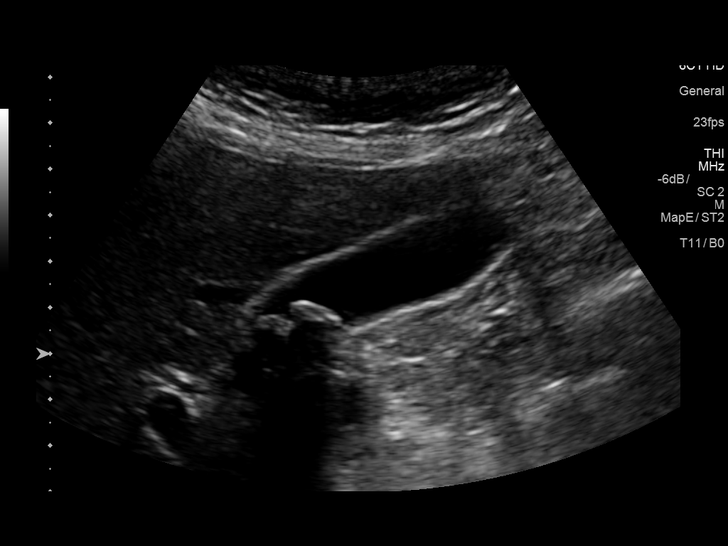
[im 7/81]
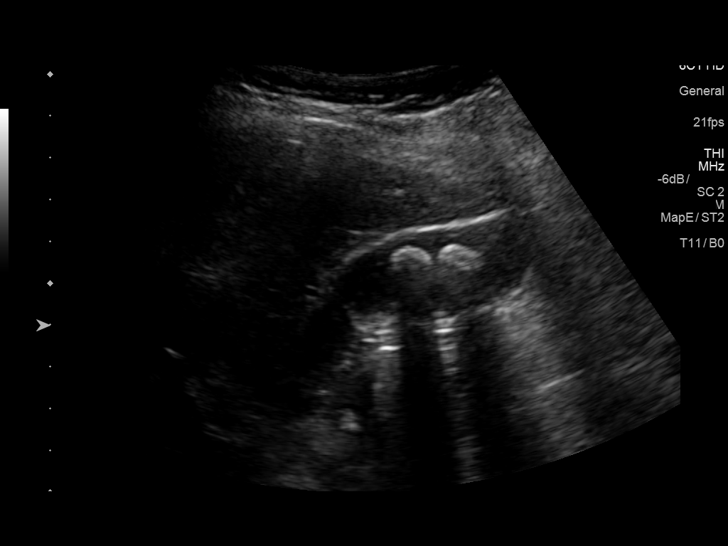
[im 14/81]
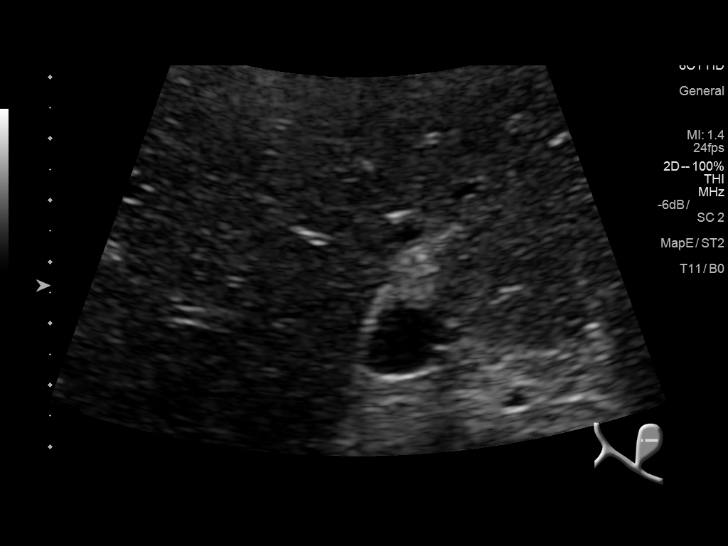
[im 21/81]
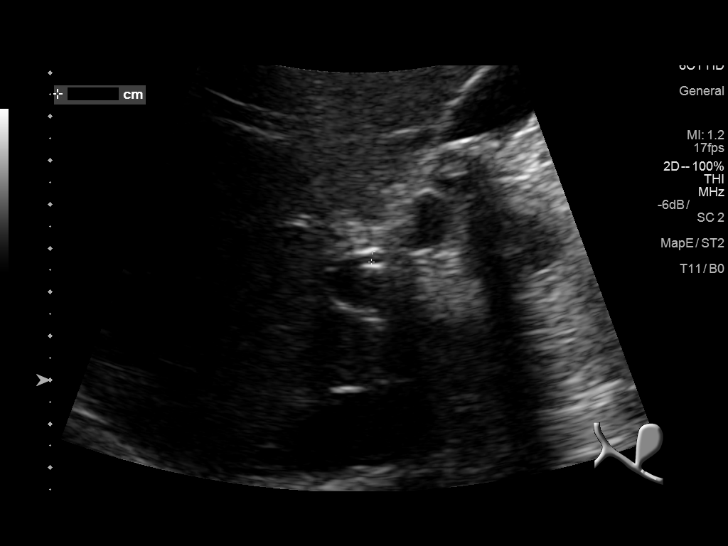
[im 27/81]
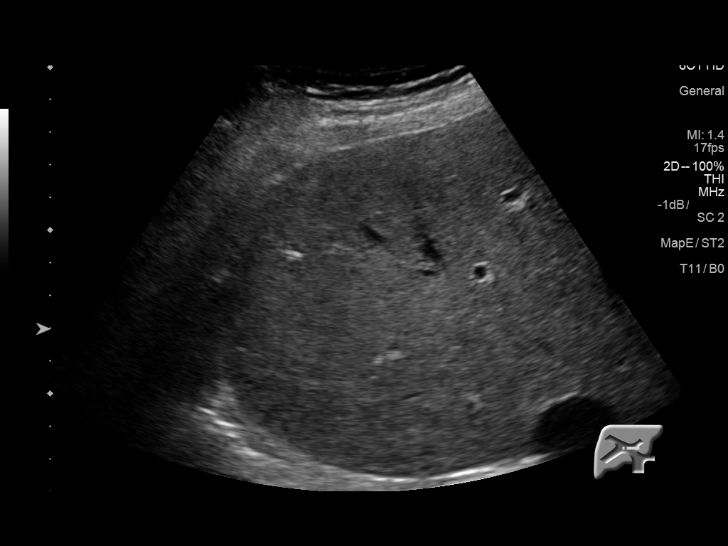
[im 34/81]
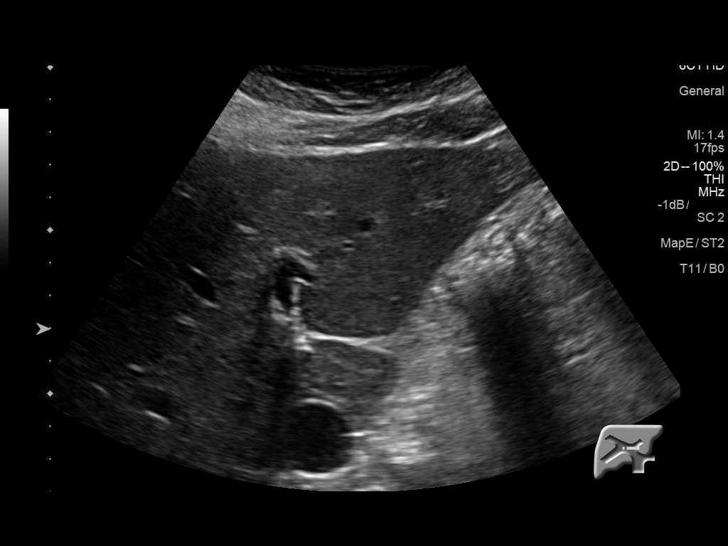
[im 41/81]
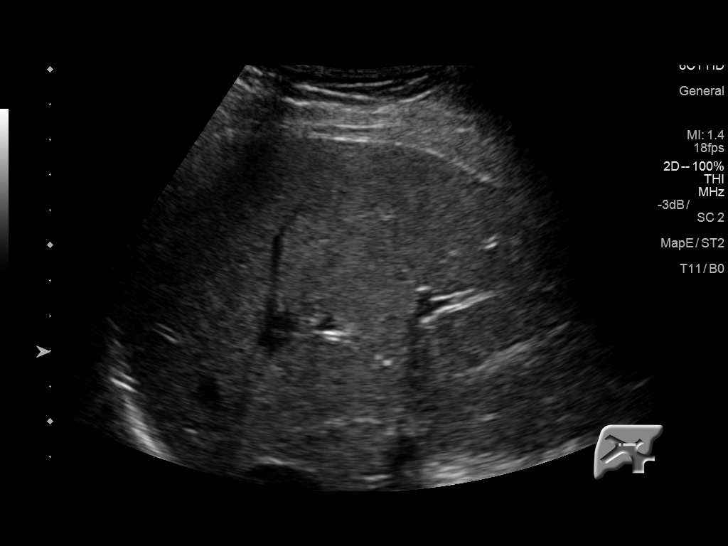
[im 47/81]
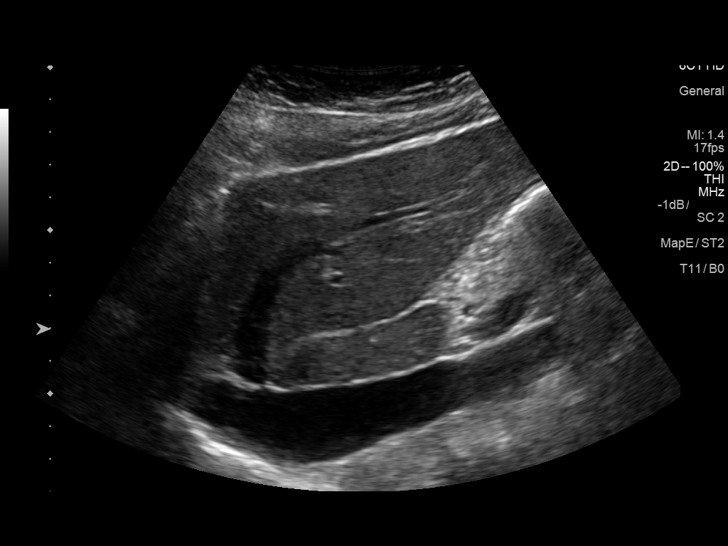
[im 54/81]
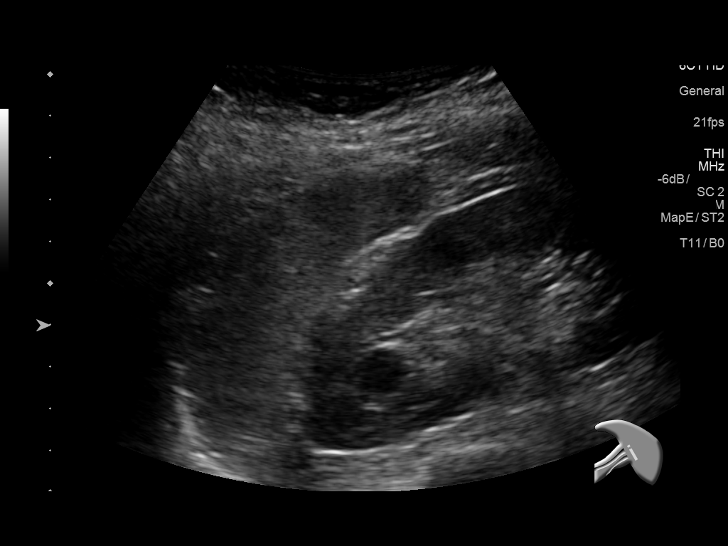
[im 61/81]
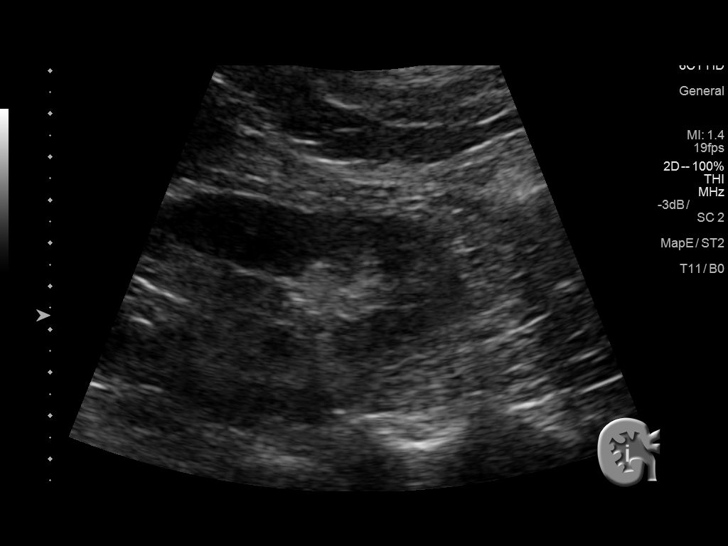
[im 67/81]
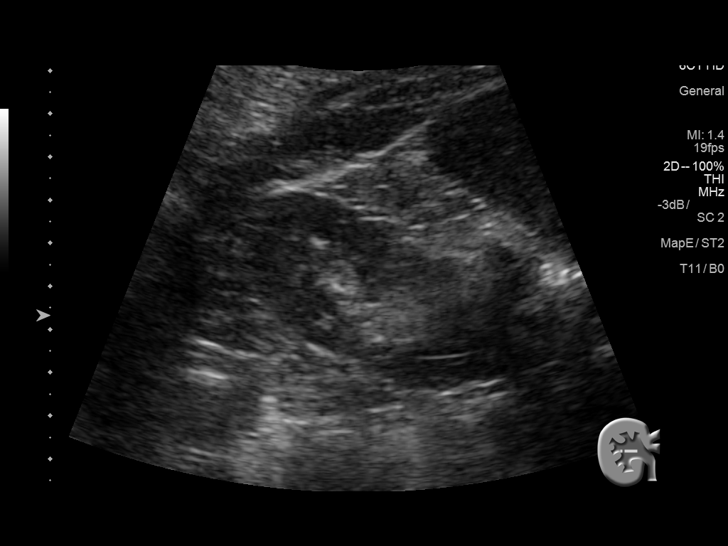
[im 74/81]
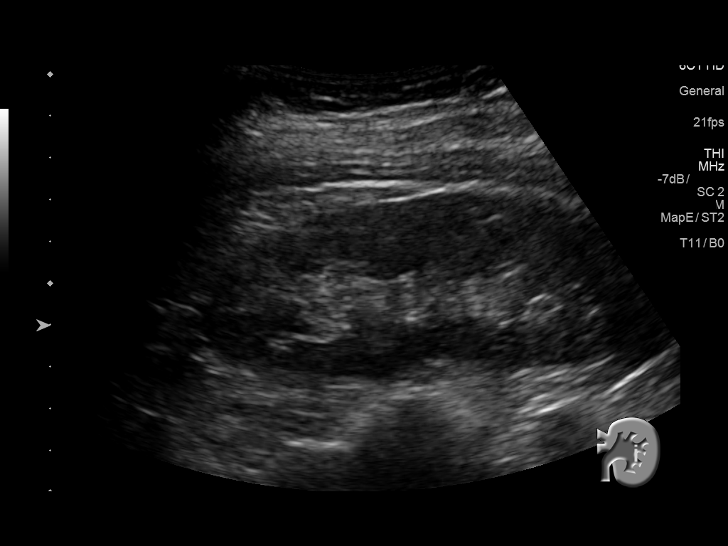
[im 81/81]
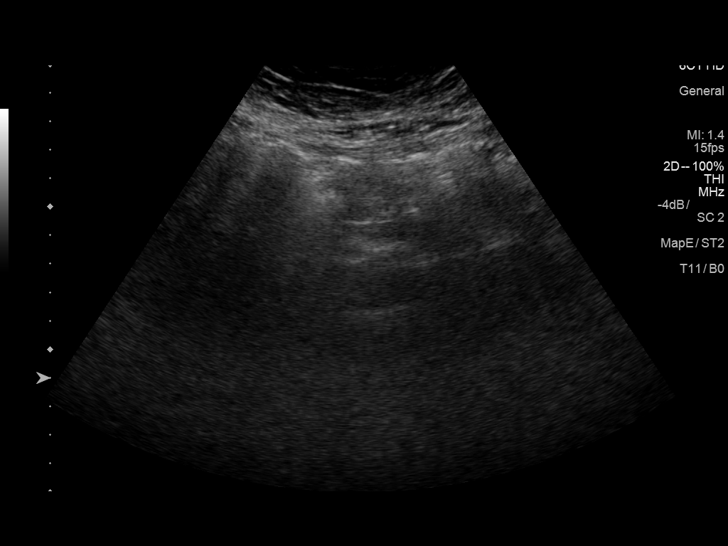

[13 of 25 positions shown; findings below may reference images not displayed]

FINDINGS: Gallbladder: The nondistended gallbladder contains 21.1 cm layering
shadowing calcified gallstones, with no gallbladder wall thickening,
pericholecystic fluid or sonographic Murphy's sign.

Common bile duct: Diameter: 2 mm

Liver: No focal lesion identified. Within normal limits in
parenchymal echogenicity.

IVC: No abnormality visualized.

Pancreas: Visualized portion unremarkable.

Spleen: Size and appearance within normal limits.

Right Kidney: Length: 10.5 cm. Echogenicity within normal limits. No
mass or hydronephrosis visualized.

Left Kidney: Length: 11.7 cm. Echogenicity within normal limits.
Simple 1.5 cm renal cyst in the upper left kidney. No suspicious
mass or hydronephrosis visualized.

Abdominal aorta: No aneurysm visualized.

Other findings: None.
IMPRESSION: 1. Cholelithiasis. No evidence of acute cholecystitis. No biliary
ductal dilatation.
2. Small simple left renal cysts.
3. Otherwise normal abdominal sonogram.

## 2018-02-01 IMAGING — US US ABDOMEN COMPLETE
1 series · 13 of 25 positions shown · non-contrast
Comparison: June 22, 2015

CLINICAL DATA: Abdominal bloating for 6 months. History of
cholelithiasis.

EXAM:
ABDOMEN ULTRASOUND COMPLETE

[Series 1: us abdomen complete · 0.24mm/px · 13 of 90 slices shown]
[im 1/90]
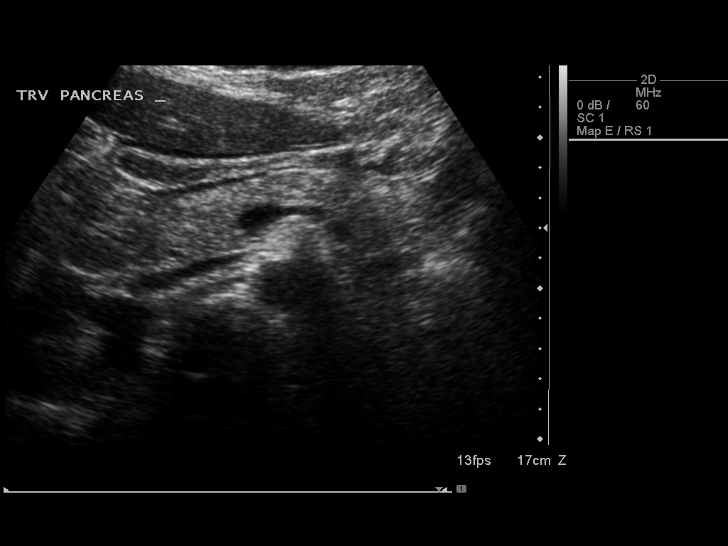
[im 8/90]
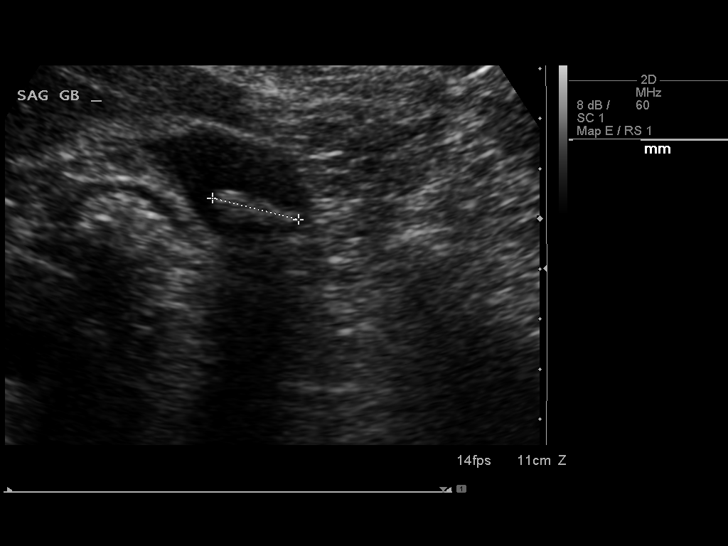
[im 15/90]
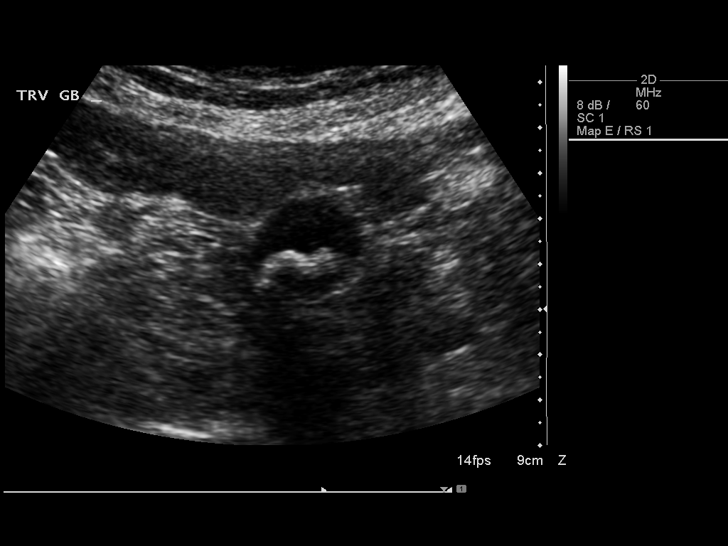
[im 23/90]
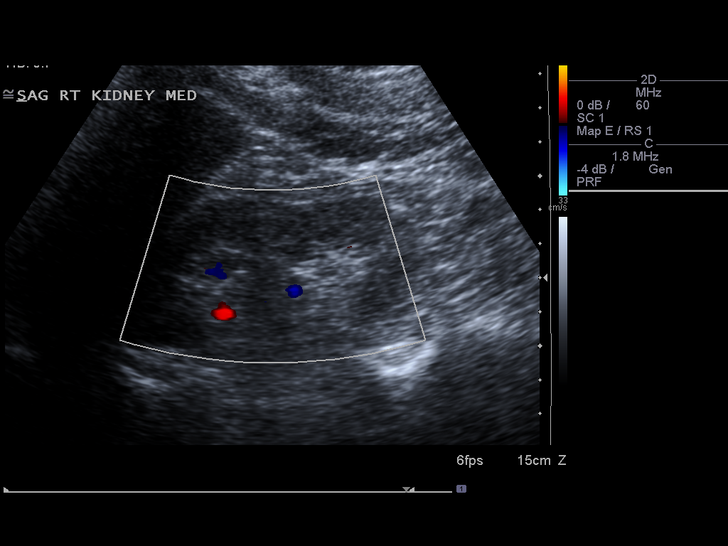
[im 30/90]
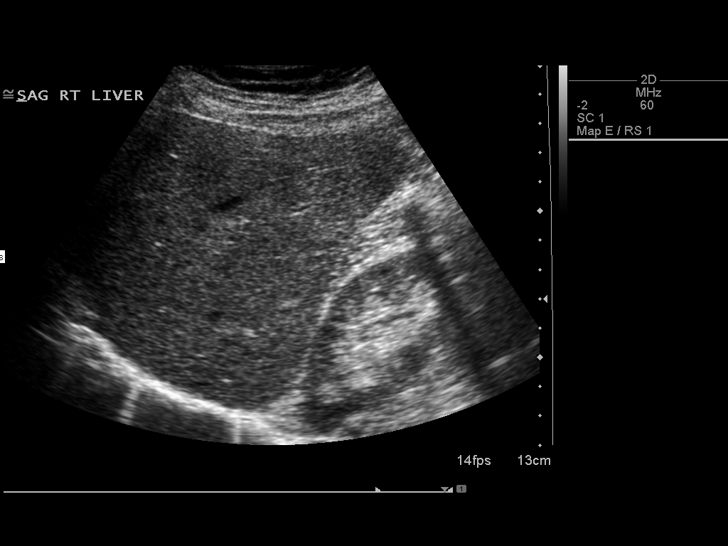
[im 38/90]
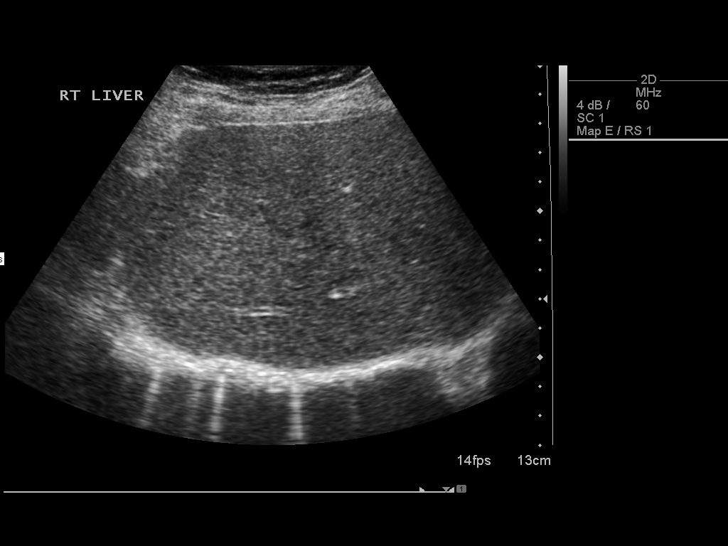
[im 45/90]
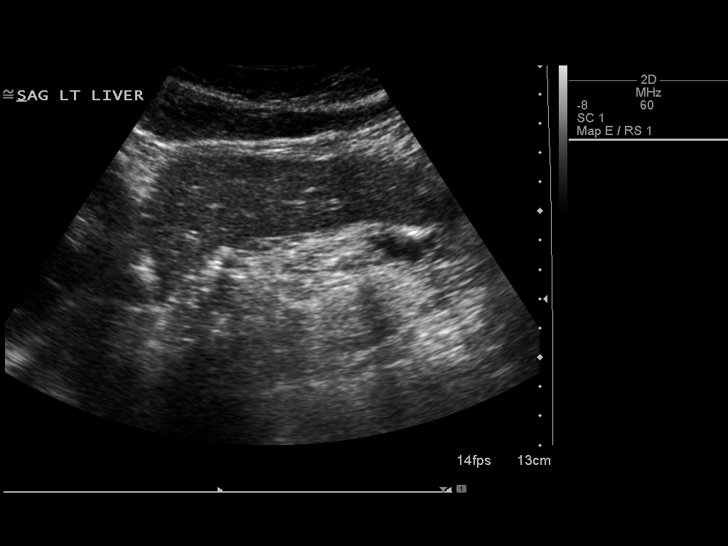
[im 52/90]
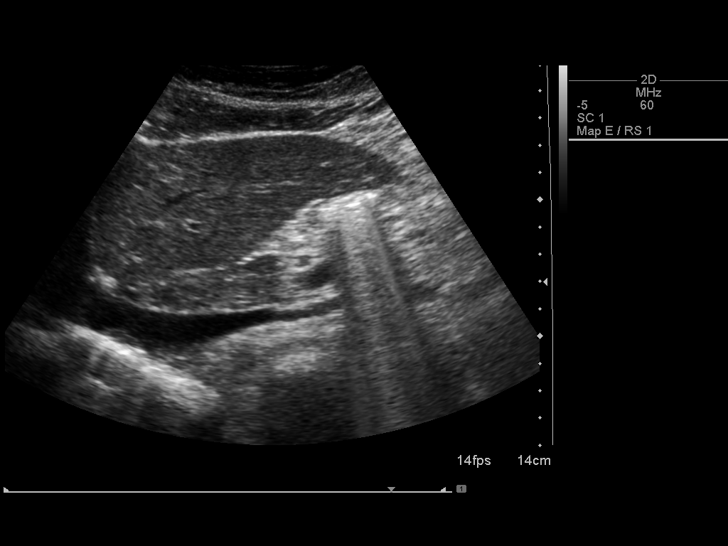
[im 60/90]
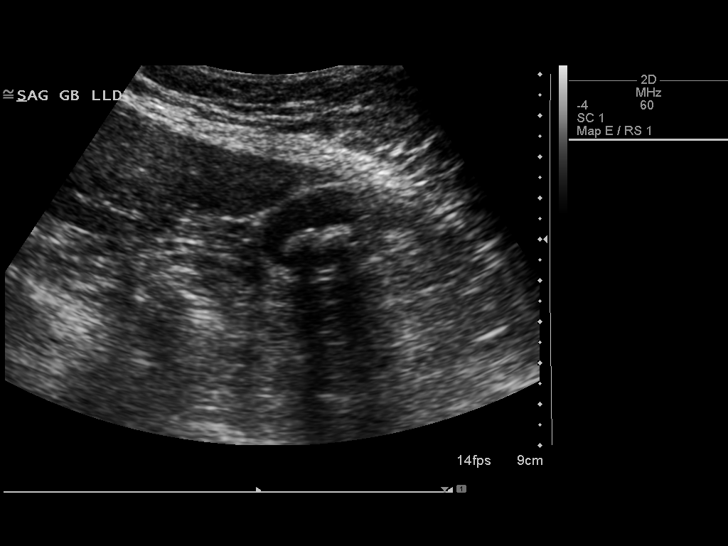
[im 67/90]
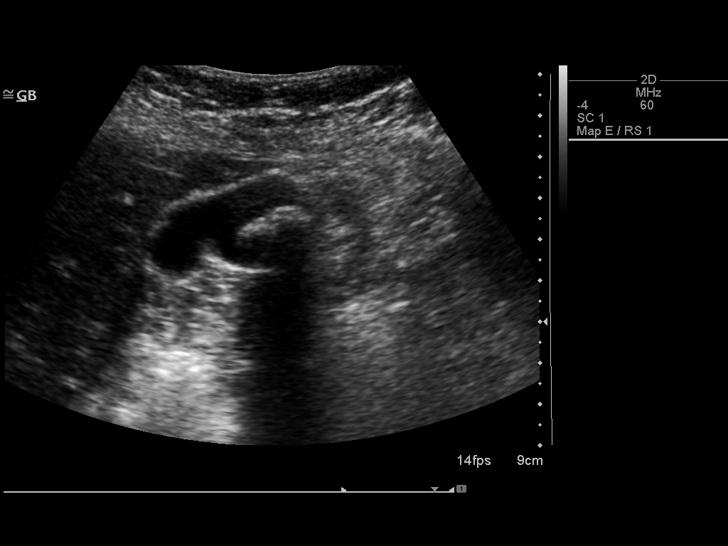
[im 75/90]
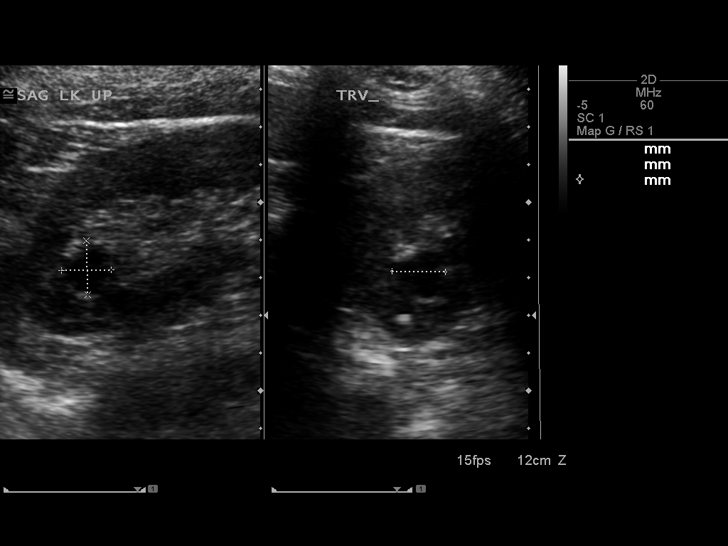
[im 82/90]
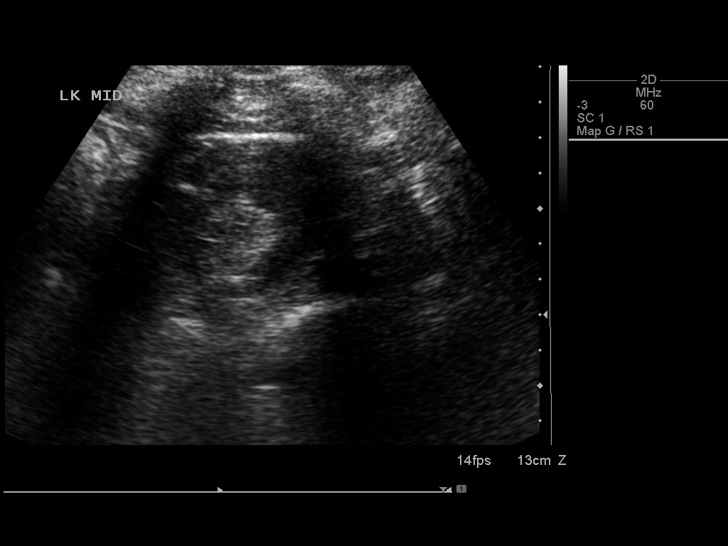
[im 90/90]
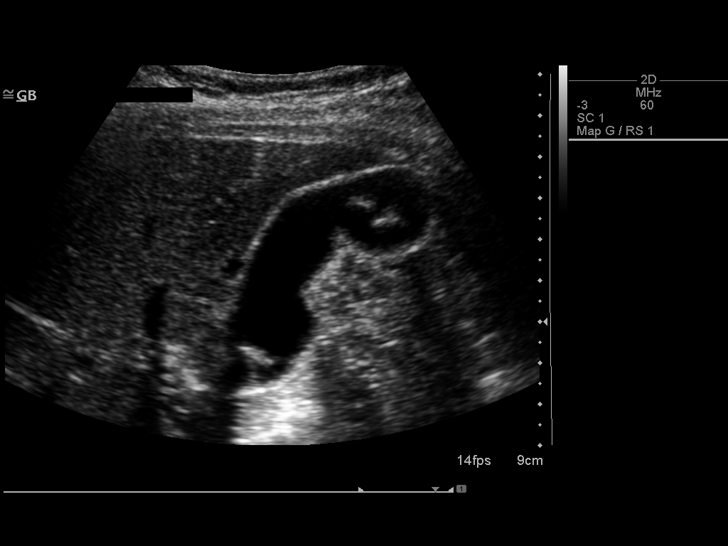

[13 of 25 positions shown; findings below may reference images not displayed]

FINDINGS: Gallbladder: Within the gallbladder, there are echogenic foci which
move and shadow consistent with cholelithiasis. Largest gallstone
measures 18 mm in length. There is no gallbladder wall thickening or
pericholecystic fluid. No sonographic Murphy sign noted by
sonographer.

Common bile duct: Diameter: 3 mm. No intrahepatic or extrahepatic
biliary duct dilatation.

Liver: No focal lesion identified. Within normal limits in
parenchymal echogenicity.

IVC: No abnormality visualized.

Pancreas: There is no pancreatic mass or inflammatory focus.

Spleen: Size and appearance within normal limits.

Right Kidney: Length: 10.0 cm. Echogenicity within normal limits. No
mass or hydronephrosis visualized.

Left Kidney: Length: 11.0 cm. Echogenicity within normal limits. No
hydronephrosis visualized. There is a cyst arising from the upper
pole left kidney measuring 1.3 x 1.4 x 1.4 cm.

Abdominal aorta: No aneurysm visualized.

Other findings: No demonstrable ascites.
IMPRESSION: Cholelithiasis. Small cyst upper pole left kidney. Study otherwise
unremarkable.

## 2018-10-20 ENCOUNTER — Telehealth: Payer: Self-pay | Admitting: Neurology
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
## 2018-10-20 NOTE — Telephone Encounter (Signed)
Called   And spoke with patient informed no available apt sooner, per patient ok to keep as schedule. [place]

## 2018-11-28 ENCOUNTER — Ambulatory Visit: Payer: Commercial Managed Care - PPO | Admitting: Neurology
